# Patient Record
Sex: Male | Born: 1975 | Race: White | Hispanic: No | Marital: Married | Smoking: Never smoker
Health system: Southern US, Community
[De-identification: ages and names within clinical notes are randomized; demographics above are authoritative.]

## PROBLEM LIST (undated history)

## (undated) DIAGNOSIS — I10 Essential (primary) hypertension: Secondary | ICD-10-CM

## (undated) DIAGNOSIS — E119 Type 2 diabetes mellitus without complications: Secondary | ICD-10-CM

## (undated) DIAGNOSIS — J189 Pneumonia, unspecified organism: Secondary | ICD-10-CM

## (undated) HISTORY — PX: CYST EXCISION: SHX5701

---
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
## 2005-06-26 ENCOUNTER — Observation Stay (HOSPITAL_COMMUNITY): Admission: EM | Admit: 2005-06-26 | Discharge: 2005-06-26 | Payer: Self-pay | Admitting: Emergency Medicine

## 2016-09-11 ENCOUNTER — Inpatient Hospital Stay (HOSPITAL_COMMUNITY): Payer: BLUE CROSS/BLUE SHIELD

## 2016-09-11 ENCOUNTER — Inpatient Hospital Stay (HOSPITAL_COMMUNITY)
Admission: EM | Admit: 2016-09-11 | Discharge: 2016-09-15 | DRG: 097 | Disposition: A | Discharge status: Home / Self Care | Payer: BLUE CROSS/BLUE SHIELD | Attending: Internal Medicine | Admitting: Internal Medicine

## 2016-09-11 ENCOUNTER — Emergency Department (HOSPITAL_COMMUNITY): Payer: BLUE CROSS/BLUE SHIELD

## 2016-09-11 ENCOUNTER — Encounter (HOSPITAL_COMMUNITY): Payer: Self-pay | Admitting: Emergency Medicine

## 2016-09-11 DIAGNOSIS — Z6839 Body mass index (BMI) 39.0-39.9, adult: Secondary | ICD-10-CM | POA: Diagnosis not present

## 2016-09-11 DIAGNOSIS — Z8701 Personal history of pneumonia (recurrent): Secondary | ICD-10-CM | POA: Diagnosis not present

## 2016-09-11 DIAGNOSIS — G44001 Cluster headache syndrome, unspecified, intractable: Secondary | ICD-10-CM | POA: Diagnosis not present

## 2016-09-11 DIAGNOSIS — R4182 Altered mental status, unspecified: Secondary | ICD-10-CM | POA: Diagnosis present

## 2016-09-11 DIAGNOSIS — G030 Nonpyogenic meningitis: Principal | ICD-10-CM | POA: Diagnosis present

## 2016-09-11 DIAGNOSIS — R51 Headache: Secondary | ICD-10-CM

## 2016-09-11 DIAGNOSIS — Z880 Allergy status to penicillin: Secondary | ICD-10-CM | POA: Diagnosis not present

## 2016-09-11 DIAGNOSIS — G9340 Encephalopathy, unspecified: Secondary | ICD-10-CM | POA: Diagnosis not present

## 2016-09-11 DIAGNOSIS — A872 Lymphocytic choriomeningitis: Secondary | ICD-10-CM | POA: Diagnosis not present

## 2016-09-11 DIAGNOSIS — G0490 Encephalitis and encephalomyelitis, unspecified: Secondary | ICD-10-CM | POA: Diagnosis not present

## 2016-09-11 DIAGNOSIS — G9349 Other encephalopathy: Secondary | ICD-10-CM | POA: Diagnosis not present

## 2016-09-11 DIAGNOSIS — R519 Headache, unspecified: Secondary | ICD-10-CM

## 2016-09-11 DIAGNOSIS — R410 Disorientation, unspecified: Secondary | ICD-10-CM | POA: Diagnosis not present

## 2016-09-11 DIAGNOSIS — F17220 Nicotine dependence, chewing tobacco, uncomplicated: Secondary | ICD-10-CM | POA: Diagnosis not present

## 2016-09-11 DIAGNOSIS — E669 Obesity, unspecified: Secondary | ICD-10-CM | POA: Diagnosis not present

## 2016-09-11 DIAGNOSIS — Z886 Allergy status to analgesic agent status: Secondary | ICD-10-CM | POA: Diagnosis not present

## 2016-09-11 HISTORY — DX: Pneumonia, unspecified organism: J18.9

## 2016-09-11 LAB — URINALYSIS, ROUTINE W REFLEX MICROSCOPIC
BILIRUBIN URINE: NEGATIVE
Glucose, UA: NEGATIVE mg/dL
HGB URINE DIPSTICK: NEGATIVE
Leukocytes, UA: NEGATIVE
NITRITE: NEGATIVE
SPECIFIC GRAVITY, URINE: 1.015 (ref 1.005–1.030)
pH: 7 (ref 5.0–8.0)

## 2016-09-11 LAB — URINE MICROSCOPIC-ADD ON

## 2016-09-11 LAB — I-STAT CHEM 8, ED
BUN: 14 mg/dL (ref 6–20)
CHLORIDE: 103 mmol/L (ref 101–111)
CREATININE: 0.9 mg/dL (ref 0.61–1.24)
Calcium, Ion: 1.18 mmol/L (ref 1.15–1.40)
GLUCOSE: 189 mg/dL — AB (ref 65–99)
HCT: 43 % (ref 39.0–52.0)
Hemoglobin: 14.6 g/dL (ref 13.0–17.0)
POTASSIUM: 3.7 mmol/L (ref 3.5–5.1)
Sodium: 140 mmol/L (ref 135–145)
TCO2: 23 mmol/L (ref 0–100)

## 2016-09-11 LAB — COMPREHENSIVE METABOLIC PANEL
ALT: 22 U/L (ref 17–63)
AST: 25 U/L (ref 15–41)
Albumin: 4.5 g/dL (ref 3.5–5.0)
Alkaline Phosphatase: 48 U/L (ref 38–126)
Anion gap: 10 (ref 5–15)
BILIRUBIN TOTAL: 0.6 mg/dL (ref 0.3–1.2)
BUN: 14 mg/dL (ref 6–20)
CHLORIDE: 105 mmol/L (ref 101–111)
CO2: 24 mmol/L (ref 22–32)
CREATININE: 0.94 mg/dL (ref 0.61–1.24)
Calcium: 9.6 mg/dL (ref 8.9–10.3)
Glucose, Bld: 198 mg/dL — ABNORMAL HIGH (ref 65–99)
POTASSIUM: 3.7 mmol/L (ref 3.5–5.1)
Sodium: 139 mmol/L (ref 135–145)
TOTAL PROTEIN: 7.7 g/dL (ref 6.5–8.1)

## 2016-09-11 LAB — CBC WITH DIFFERENTIAL/PLATELET
Basophils Absolute: 0 10*3/uL (ref 0.0–0.1)
Basophils Relative: 0 %
EOS PCT: 2 %
Eosinophils Absolute: 0.2 10*3/uL (ref 0.0–0.7)
HEMATOCRIT: 43 % (ref 39.0–52.0)
Hemoglobin: 14.7 g/dL (ref 13.0–17.0)
LYMPHS ABS: 2.1 10*3/uL (ref 0.7–4.0)
LYMPHS PCT: 27 %
MCH: 29.5 pg (ref 26.0–34.0)
MCHC: 34.2 g/dL (ref 30.0–36.0)
MCV: 86.3 fL (ref 78.0–100.0)
MONO ABS: 0.4 10*3/uL (ref 0.1–1.0)
MONOS PCT: 5 %
Neutro Abs: 5.2 10*3/uL (ref 1.7–7.7)
Neutrophils Relative %: 66 %
PLATELETS: 193 10*3/uL (ref 150–400)
RBC: 4.98 MIL/uL (ref 4.22–5.81)
RDW: 13 % (ref 11.5–15.5)
WBC: 7.9 10*3/uL (ref 4.0–10.5)

## 2016-09-11 LAB — SALICYLATE LEVEL

## 2016-09-11 LAB — I-STAT CG4 LACTIC ACID, ED
Lactic Acid, Venous: 3.12 mmol/L (ref 0.5–1.9)
Lactic Acid, Venous: 2.88 mmol/L (ref 0.5–1.9)

## 2016-09-11 LAB — RAPID URINE DRUG SCREEN, HOSP PERFORMED
Amphetamines: POSITIVE — AB
Barbiturates: NOT DETECTED
Benzodiazepines: NOT DETECTED
COCAINE: NOT DETECTED
OPIATES: NOT DETECTED
TETRAHYDROCANNABINOL: NOT DETECTED

## 2016-09-11 LAB — I-STAT TROPONIN, ED: TROPONIN I, POC: 0 ng/mL (ref 0.00–0.08)

## 2016-09-11 LAB — ACETAMINOPHEN LEVEL

## 2016-09-11 MED ORDER — DEXTROSE 5 % IV SOLN
10.0000 mg/kg | Freq: Three times a day (TID) | INTRAVENOUS | Status: DC
  Administered 2016-09-12 – 2016-09-15 (×10): 935 mg via INTRAVENOUS
  Filled 2016-09-11 (×17): qty 18.7

## 2016-09-11 MED ORDER — DOXYCYCLINE HYCLATE 100 MG IV SOLR
100.0000 mg | Freq: Two times a day (BID) | INTRAVENOUS | Status: DC
  Administered 2016-09-11 – 2016-09-15 (×8): 100 mg via INTRAVENOUS
  Filled 2016-09-11 (×10): qty 100

## 2016-09-11 MED ORDER — ACETAMINOPHEN 650 MG RE SUPP
650.0000 mg | Freq: Four times a day (QID) | RECTAL | Status: DC | PRN
  Administered 2016-09-11: 650 mg via RECTAL
  Filled 2016-09-11: qty 1

## 2016-09-11 MED ORDER — STERILE WATER FOR INJECTION IJ SOLN
INTRAMUSCULAR | Status: AC
  Administered 2016-09-11: 1.2 mL
  Filled 2016-09-11: qty 10

## 2016-09-11 MED ORDER — LORAZEPAM 2 MG/ML IJ SOLN
INTRAMUSCULAR | Status: AC
  Filled 2016-09-11: qty 1

## 2016-09-11 MED ORDER — ONDANSETRON HCL 4 MG PO TABS: 4.0000 mg | ORAL_TABLET | Freq: Four times a day (QID) | ORAL | Status: DC | PRN

## 2016-09-11 MED ORDER — DEXTROSE 5 % IV SOLN
2.0000 g | Freq: Two times a day (BID) | INTRAVENOUS | Status: DC
  Administered 2016-09-11 – 2016-09-15 (×8): 2 g via INTRAVENOUS
  Filled 2016-09-11 (×9): qty 2

## 2016-09-11 MED ORDER — LORAZEPAM 2 MG/ML IJ SOLN
2.0000 mg | INTRAMUSCULAR | Status: DC | PRN
  Administered 2016-09-11: 2 mg via INTRAVENOUS
  Filled 2016-09-11: qty 1

## 2016-09-11 MED ORDER — DEXTROSE 5 % IV SOLN
2.0000 g | Freq: Once | INTRAVENOUS | Status: AC
  Administered 2016-09-11: 2 g via INTRAVENOUS
  Filled 2016-09-11: qty 2

## 2016-09-11 MED ORDER — NALOXONE HCL 2 MG/2ML IJ SOSY
1.0000 mg | PREFILLED_SYRINGE | Freq: Once | INTRAMUSCULAR | Status: AC
  Administered 2016-09-11: 1 mg via INTRAVENOUS
  Filled 2016-09-11: qty 2

## 2016-09-11 MED ORDER — LORAZEPAM 2 MG/ML IJ SOLN
1.0000 mg | Freq: Once | INTRAMUSCULAR | Status: AC
  Administered 2016-09-11: 1 mg via INTRAVENOUS
  Filled 2016-09-11: qty 1

## 2016-09-11 MED ORDER — DEXTROSE 5 % IV SOLN
10.0000 mg/kg | Freq: Once | INTRAVENOUS | Status: AC
  Administered 2016-09-11: 800 mg via INTRAVENOUS
  Filled 2016-09-11: qty 16

## 2016-09-11 MED ORDER — SODIUM CHLORIDE 0.9 % IV SOLN
INTRAVENOUS | Status: DC
  Administered 2016-09-11 – 2016-09-15 (×4): via INTRAVENOUS

## 2016-09-11 MED ORDER — VANCOMYCIN HCL IN DEXTROSE 1-5 GM/200ML-% IV SOLN
1000.0000 mg | Freq: Three times a day (TID) | INTRAVENOUS | Status: DC
  Administered 2016-09-12 (×3): 1000 mg via INTRAVENOUS
  Filled 2016-09-11 (×4): qty 200

## 2016-09-11 MED ORDER — HALOPERIDOL LACTATE 5 MG/ML IJ SOLN
5.0000 mg | Freq: Four times a day (QID) | INTRAMUSCULAR | Status: DC | PRN
  Administered 2016-09-11 (×2): 5 mg via INTRAVENOUS
  Filled 2016-09-11 (×2): qty 1

## 2016-09-11 MED ORDER — DOXYCYCLINE HYCLATE 100 MG IV SOLR
100.0000 mg | Freq: Once | INTRAVENOUS | Status: AC
  Administered 2016-09-11: 100 mg via INTRAVENOUS
  Filled 2016-09-11: qty 100

## 2016-09-11 MED ORDER — ACETAMINOPHEN 650 MG RE SUPP
650.0000 mg | Freq: Once | RECTAL | Status: AC
  Administered 2016-09-11: 650 mg via RECTAL
  Filled 2016-09-11: qty 1

## 2016-09-11 MED ORDER — ZIPRASIDONE MESYLATE 20 MG IM SOLR
10.0000 mg | Freq: Once | INTRAMUSCULAR | Status: AC
  Administered 2016-09-11: 10 mg via INTRAMUSCULAR
  Filled 2016-09-11: qty 20

## 2016-09-11 MED ORDER — ONDANSETRON HCL 4 MG/2ML IJ SOLN
INTRAMUSCULAR | Status: AC
  Administered 2016-09-11: 4 mg
  Filled 2016-09-11: qty 2

## 2016-09-11 MED ORDER — VANCOMYCIN HCL IN DEXTROSE 1-5 GM/200ML-% IV SOLN
1000.0000 mg | Freq: Once | INTRAVENOUS | Status: AC
  Administered 2016-09-11: 1000 mg via INTRAVENOUS
  Filled 2016-09-11: qty 200

## 2016-09-11 MED ORDER — ACETAMINOPHEN 325 MG PO TABS
650.0000 mg | ORAL_TABLET | Freq: Four times a day (QID) | ORAL | Status: DC | PRN
  Administered 2016-09-12 – 2016-09-15 (×5): 650 mg via ORAL
  Filled 2016-09-11 (×5): qty 2

## 2016-09-11 MED ORDER — SODIUM CHLORIDE 0.9 % IV BOLUS (SEPSIS)
1000.0000 mL | Freq: Once | INTRAVENOUS | Status: AC
  Administered 2016-09-11: 1000 mL via INTRAVENOUS

## 2016-09-11 MED ORDER — ONDANSETRON HCL 4 MG/2ML IJ SOLN: 4.0000 mg | Freq: Four times a day (QID) | INTRAMUSCULAR | Status: DC | PRN

## 2016-09-11 MED ORDER — VANCOMYCIN HCL 10 G IV SOLR
1500.0000 mg | Freq: Once | INTRAVENOUS | Status: AC
  Administered 2016-09-11: 1500 mg via INTRAVENOUS
  Filled 2016-09-11: qty 1500

## 2016-09-11 NOTE — ED Notes (Signed)
MD Zammit notified of repeat lactic of 2.88.

## 2016-09-11 NOTE — Progress Notes (Signed)
Dr. Clearence PedSchorr notified patient passed stroke swallow screen requested to be performed by neurologist. Patient immediately back to sleep post test. Awaiting response.

## 2016-09-11 NOTE — ED Notes (Signed)
Soft restraints checked on wrist and ankles at this time.

## 2016-09-11 NOTE — ED Provider Notes (Signed)
[facility]-EMERGENCY DEPT Provider Note   CSN: [id number] Arrival date & time: 09/11/16  0806  By signing my name below, I, Clovis PuAvnee Patel, attest that this documentation has been prepared under the direction and in the presence of Bethann BerkshireJoseph Jeremih Dearmas, MD  Electronically Signed: Clovis PuAvnee Patel, ED Scribe. 09/11/16. 8:34 AM.  LEVEL 5 CAVEAT DUE TO ALTERED MENTAL STATUS    History   Chief Complaint Chief Complaint  Patient presents with  . Altered Mental Status    Patient has had headaches off and on for a few days now he has been taken Sudafed cold medicine. His wife states that he awoke this morning around 6 and was confused   The history is provided by a parent and the spouse. No language interpreter was used.  Altered Mental Status   This is a new problem. The current episode started 1 to 2 hours ago. The problem has not changed since onset.Associated symptoms include weakness. Risk factors include the patient not taking medications correctly. His past medical history does not include seizures.   HPI Comments: LEVEL 5 CAVEAT DUE TO ALTERED MENTAL STATUS   Ethan Logan is a 40 y.o. male who presents to the Emergency Department with altered mental status x today. Per family, pt took a handful of 600 mg ibuprofen due to a headache. Father states pt's wife called the ambulance. Wife notes pt awoke at 6 AM today with confusion.   No past medical history on file.  There are no active problems to display for this patient.   No past surgical history on file.   Home Medications    Prior to Admission medications   Not on File    Family History No family history on file.  Social History Social History  Substance Use Topics  . Smoking status: Not on file  . Smokeless tobacco: Not on file  . Alcohol use Not on file     Allergies   Patient has no allergy information on record.   Review of Systems Review of Systems  Unable to perform ROS: Mental status change  Neurological:  Positive for weakness.   Physical Exam Updated Vital Signs There were no vitals taken for this visit.  Physical Exam  Constitutional: He appears well-developed.  HENT:  Head: Normocephalic.  Eyes: Conjunctivae and EOM are normal. No scleral icterus.  Neck: Neck supple. No thyromegaly present.  Cardiovascular: Normal rate and regular rhythm.  Exam reveals no gallop and no friction rub.   No murmur heard. Pulmonary/Chest: No stridor. He has no wheezes. He has no rales. He exhibits no tenderness.  Abdominal: He exhibits no distension. There is no tenderness. There is no rebound.  Musculoskeletal: Normal range of motion. He exhibits no edema.  Lymphadenopathy:    He has no cervical adenopathy.  Neurological: He is alert. He exhibits normal muscle tone. Coordination normal.  Moderate weakness of right upper extremity and right lower extremity.   Patient is alert. He will respond to his name. Patient confused and not answering questions appropriately  Skin: No rash noted. No erythema.  Nursing note and vitals reviewed.    ED Treatments / Results  DIAGNOSTIC STUDIES:  Oxygen Saturation is 100% on RA, normal by my interpretation.    COORDINATION OF CARE:  8:27 AM Discussed treatment plan with pt at bedside and pt agreed to plan.  Labs (all labs ordered are listed, but only abnormal results are displayed) Labs Reviewed - No data to display  EKG  EKG Interpretation None  Radiology No results found.  Procedures Procedures (including critical care time)  Medications Ordered in ED Medications - No data to display   Initial Impression / Assessment and Plan / ED Course  I have reviewed the triage vital signs and the nursing notes.  Pertinent labs & imaging results that were available during my care of the patient were reviewed by me and considered in my medical decision making (see chart for details).  Clinical Course     Altered mental status. Labs including  urine CT scan chest x-ray all unremarkable. I consult a the neural hospitalist and they recommended treating the patient for possible meningitis with Rocephin and vancomycin acyclovir. Patient was given 2 mg of Ativan and tented Geodon but LP was unable to be performed because the patient would not stay still. [facility] hospital[facility] consult and saw the patient and stated [facility]. Hospital suggested adding doxycycline to his treatment [facility]. CRITICAL CARE Performed by: Erving Sassano L Total critical care time:45 minutes Critical care time was exclusive of separately billable procedures and treating other patients. Critical care was necessary to treat or prevent imminent or life-threatening deterioration. Critical care was time spent personally by me on the following activities: development of treatment plan with patient and/or surrogate as well as nursing, discussions with consultants, evaluation of patient's response to treatment, examination of patient, obtaining history from patient or surrogate, ordering and performing treatments and interventions, ordering and review of laboratory studies, ordering and review of radiographic studies, pulse oximetry and re-evaluation of patient's condition.  Final Clinical Impressions(s) / ED Diagnoses   Final diagnoses:  None    New Prescriptions New Prescriptions   No medications on file     Bethann BerkshireJoseph Orah Sonnen, MD 09/11/16 1150

## 2016-09-11 NOTE — ED Notes (Signed)
Unable to obtain BP readings at this time due to lab work.

## 2016-09-11 NOTE — Consult Note (Signed)
Admission H&P    Chief Complaint: altered mental status with febrile illness.  HPI: Ethan Logan is an 40 y.o. male with no known past medical history transferred from [facility] for further evaluation of acute mental status change with confusion. Patient reportedly has had upper respiratory infection type symptoms over the past 1 week with nasal congestion. He was noted at about 1 AM to have confusion which became progressively worse. Intermittent myoclonic type jerking movements were noted. No frank seizure activity was reported. He was febrile with temperature up to 101.7. CT scan of his head showed no acute intracranial abnormality. Attempted lumbar puncture was unsuccessful. He was started on vancomycin and on doxycycline. He was also started on acyclovir. Mental status has improved. He is still confused but responds to others around him verbally. No focal deficits were noted at any point.  History reviewed. No pertinent past medical history.  History reviewed. No pertinent surgical history.  No family history on file. Social History:  reports that he has never smoked. He has never used smokeless tobacco. He reports that he drinks alcohol. He reports that he does not use drugs.  Allergies:  Allergies  Allergen Reactions  . Aspirin Swelling  . Penicillins Rash    No prescriptions prior to admission.    ROS: Unobtainable as patient remains confused.  Physical Examination: Blood pressure 119/71, pulse 69, temperature 100.2 F (37.9 C), temperature source Axillary, resp. rate (!) 24, height '6\' 1"'  (1.854 m), weight 134.1 kg (295 lb 9.6 oz), SpO2 95 %.  HEENT-  Normocephalic, no lesions, without obvious abnormality.  Normal external eye and conjunctiva.  Normal TM's bilaterally.  Normal auditory canals and external ears. Normal external nose, mucus membranes and septum.  Normal pharynx. Neck supple with no masses, nodes, nodules or enlargement. No clinical signs of meningeal  irritation Cardiovascular - regular rate and rhythm, S1, S2 normal, no murmur, click, rub or gallop Lungs - chest clear, no wheezing, rales, normal symmetric air entry Abdomen - soft, non-tender; bowel sounds normal; no masses,  no organomegaly Extremities - no joint deformities, effusion, or inflammation  Neurologic Examination: Mental Status: Slightly lethargic, disoriented to time and place, agitated, requiring 4. restraints.  Speech fluent without evidence of aphasia. Able to follow commands without difficulty. Cranial Nerves: II-Visual fields were normal. III/IV/VI-Pupils were equal and reacted normally to light. Extraocular movements were full and conjugate.    V/VII-no facial numbness and no facial weakness. VIII-normal. X-normal speech. XI: trapezius strength/neck flexion strength normal bilaterally XII-midline tongue extension with normal strength. Motor: 5/5 bilaterally with normal tone and bulk Sensory: Normal throughout. Deep Tendon Reflexes: 2+ and symmetric. Plantars: mute bilaterally  Results for orders placed or performed during the hospital encounter of 09/11/16 (from the past 48 hour(s))  CBC with Differential/Platelet     Status: None   Collection Time: 09/11/16  8:38 AM  Result Value Ref Range   WBC 7.9 4.0 - 10.5 K/uL   RBC 4.98 4.22 - 5.81 MIL/uL   Hemoglobin 14.7 13.0 - 17.0 g/dL   HCT 43.0 39.0 - 52.0 %   MCV 86.3 78.0 - 100.0 fL   MCH 29.5 26.0 - 34.0 pg   MCHC 34.2 30.0 - 36.0 g/dL   RDW 13.0 11.5 - 15.5 %   Platelets 193 150 - 400 K/uL   Neutrophils Relative % 66 %   Neutro Abs 5.2 1.7 - 7.7 K/uL   Lymphocytes Relative 27 %   Lymphs Abs 2.1 0.7 - 4.0 K/uL  Monocytes Relative 5 %   Monocytes Absolute 0.4 0.1 - 1.0 K/uL   Eosinophils Relative 2 %   Eosinophils Absolute 0.2 0.0 - 0.7 K/uL   Basophils Relative 0 %   Basophils Absolute 0.0 0.0 - 0.1 K/uL  Comprehensive metabolic panel     Status: Abnormal   Collection Time: 09/11/16  8:38 AM   Result Value Ref Range   Sodium 139 135 - 145 mmol/L   Potassium 3.7 3.5 - 5.1 mmol/L   Chloride 105 101 - 111 mmol/L   CO2 24 22 - 32 mmol/L   Glucose, Bld 198 (H) 65 - 99 mg/dL   BUN 14 6 - 20 mg/dL   Creatinine, Ser 0.94 0.61 - 1.24 mg/dL   Calcium 9.6 8.9 - 10.3 mg/dL   Total Protein 7.7 6.5 - 8.1 g/dL   Albumin 4.5 3.5 - 5.0 g/dL   AST 25 15 - 41 U/L   ALT 22 17 - 63 U/L   Alkaline Phosphatase 48 38 - 126 U/L   Total Bilirubin 0.6 0.3 - 1.2 mg/dL   GFR calc non Af Amer >60 >60 mL/min   GFR calc Af Amer >60 >60 mL/min    Comment: (NOTE) The eGFR has been calculated using the CKD EPI equation. This calculation has not been validated in all clinical situations. eGFR's persistently <60 mL/min signify possible Chronic Kidney Disease.    Anion gap 10 5 - 15  Acetaminophen level     Status: Abnormal   Collection Time: 09/11/16  8:40 AM  Result Value Ref Range   Acetaminophen (Tylenol), Serum <10 (L) 10 - 30 ug/mL    Comment:        THERAPEUTIC CONCENTRATIONS VARY SIGNIFICANTLY. A RANGE OF 10-30 ug/mL MAY BE AN EFFECTIVE CONCENTRATION FOR MANY PATIENTS. HOWEVER, SOME ARE BEST TREATED AT CONCENTRATIONS OUTSIDE THIS RANGE. ACETAMINOPHEN CONCENTRATIONS >150 ug/mL AT 4 HOURS AFTER INGESTION AND >50 ug/mL AT 12 HOURS AFTER INGESTION ARE OFTEN ASSOCIATED WITH TOXIC REACTIONS.   Salicylate level     Status: None   Collection Time: 09/11/16  8:40 AM  Result Value Ref Range   Salicylate Lvl <4.6 2.8 - 30.0 mg/dL  Blood culture (routine x 2)     Status: None (Preliminary result)   Collection Time: 09/11/16  8:42 AM  Result Value Ref Range   Specimen Description BLOOD LEFT ANTECUBITAL    Special Requests BOTTLES DRAWN AEROBIC AND ANAEROBIC 6CC    Culture PENDING    Report Status PENDING   I-stat chem 8, ed     Status: Abnormal   Collection Time: 09/11/16  8:50 AM  Result Value Ref Range   Sodium 140 135 - 145 mmol/L   Potassium 3.7 3.5 - 5.1 mmol/L   Chloride 103 101 -  111 mmol/L   BUN 14 6 - 20 mg/dL   Creatinine, Ser 0.90 0.61 - 1.24 mg/dL   Glucose, Bld 189 (H) 65 - 99 mg/dL   Calcium, Ion 1.18 1.15 - 1.40 mmol/L   TCO2 23 0 - 100 mmol/L   Hemoglobin 14.6 13.0 - 17.0 g/dL   HCT 43.0 39.0 - 52.0 %  I-Stat CG4 Lactic Acid, ED     Status: Abnormal   Collection Time: 09/11/16  8:51 AM  Result Value Ref Range   Lactic Acid, Venous 3.12 (HH) 0.5 - 1.9 mmol/L   Comment NOTIFIED PHYSICIAN   I-stat troponin, ED     Status: None   Collection Time: 09/11/16  8:55 AM  Result Value Ref Range   Troponin i, poc 0.00 0.00 - 0.08 ng/mL   Comment 3            Comment: Due to the release kinetics of cTnI, a negative result within the first hours of the onset of symptoms does not rule out myocardial infarction with certainty. If myocardial infarction is still suspected, repeat the test at appropriate intervals.   Urinalysis, Routine w reflex microscopic (not at [facility])     Status: Abnormal   Collection Time: 09/11/16  9:03 AM  Result Value Ref Range   Color, Urine YELLOW YELLOW   APPearance CLEAR CLEAR   Specific Gravity, Urine 1.015 1.005 - 1.030   pH 7.0 5.0 - 8.0   Glucose, UA NEGATIVE NEGATIVE mg/dL   Hgb urine dipstick NEGATIVE NEGATIVE   Bilirubin Urine NEGATIVE NEGATIVE   Ketones, ur TRACE (A) NEGATIVE mg/dL   Protein, ur TRACE (A) NEGATIVE mg/dL   Nitrite NEGATIVE NEGATIVE   Leukocytes, UA NEGATIVE NEGATIVE  Rapid urine drug screen (hospital performed)     Status: Abnormal   Collection Time: 09/11/16  9:03 AM  Result Value Ref Range   Opiates NONE DETECTED NONE DETECTED   Cocaine NONE DETECTED NONE DETECTED   Benzodiazepines NONE DETECTED NONE DETECTED   Amphetamines POSITIVE (A) NONE DETECTED   Tetrahydrocannabinol NONE DETECTED NONE DETECTED   Barbiturates NONE DETECTED NONE DETECTED    Comment:        DRUG SCREEN FOR MEDICAL PURPOSES ONLY.  IF CONFIRMATION IS NEEDED FOR ANY PURPOSE, NOTIFY LAB WITHIN 5 DAYS.        LOWEST DETECTABLE  LIMITS FOR URINE DRUG SCREEN Drug Class       Cutoff (ng/mL) Amphetamine      1000 Barbiturate      200 Benzodiazepine   419 Tricyclics       379 Opiates          300 Cocaine          300 THC              50   Urine microscopic-add on     Status: Abnormal   Collection Time: 09/11/16  9:03 AM  Result Value Ref Range   Squamous Epithelial / LPF 0-5 (A) NONE SEEN   WBC, UA 0-5 0 - 5 WBC/hpf   RBC / HPF 0-5 0 - 5 RBC/hpf   Bacteria, UA FEW (A) NONE SEEN   Urine-Other MUCOUS PRESENT   Blood culture (routine x 2)     Status: None (Preliminary result)   Collection Time: 09/11/16 10:27 AM  Result Value Ref Range   Specimen Description BLOOD BLOOD LEFT FOREARM    Special Requests BOTTLES DRAWN AEROBIC AND ANAEROBIC 6CC    Culture PENDING    Report Status PENDING   I-Stat CG4 Lactic Acid, ED     Status: Abnormal   Collection Time: 09/11/16 12:24 PM  Result Value Ref Range   Lactic Acid, Venous 2.88 (HH) 0.5 - 1.9 mmol/L   Comment NOTIFIED PHYSICIAN    Ct Head Wo Contrast  Result Date: 09/11/2016 CLINICAL DATA:  Altered mental status.  Headache.  Confusion. EXAM: CT HEAD WITHOUT CONTRAST TECHNIQUE: Contiguous axial images were obtained from the base of the skull through the vertex without intravenous contrast. COMPARISON:  None. FINDINGS: Brain: No evidence of acute infarction, hemorrhage, hydrocephalus, extra-axial collection or mass lesion/mass effect. Normal cerebral volume. No white matter disease. Vascular: No hyperdense vessel or unexpected calcification. Skull: Normal.  Negative for fracture or focal lesion. Sinuses/Orbits: No acute finding. Mild mucosal thickening LEFT division sphenoid. Other: None. IMPRESSION: Negative exam.  Findings discussed with ordering provider. Electronically Signed   By: Staci Righter M.D.   On: 09/11/2016 09:08   Dg Chest Portable 1 View  Result Date: 09/11/2016 CLINICAL DATA:  Altered mental status. EXAM: PORTABLE CHEST 1 VIEW COMPARISON:  None.  FINDINGS: Poor inspiration. Borderline enlarged cardiac silhouette. Prominent pulmonary vasculature and interstitial markings, accentuated by the poor inspiration. Unremarkable bones. IMPRESSION: No acute abnormality. Electronically Signed   By: Claudie Revering M.D.   On: 09/11/2016 09:46    Assessment/Plan 40 year old man presenting with an acute febrile illness with associated altered mental status. Patient had no focal neurological deficits, and also had no clinical signs of meningeal irritation. CNS infection is unlikely, but cannot be completely ruled out at this point.  Recommendations: 1. MRI of the brain without contrast 2. EEG, routine adult study 3. Agree with obtaining lumbar puncture under fluoroscopy 4. Continue current antibiotic and antiviral coverage  We will continue to follow this patient closely with you.  C.R. Nicole Kindred, [place] [facility]osp[facility] [phone]  09/11/2016, 9:48 PM

## 2016-09-11 NOTE — ED Notes (Signed)
MD Zammit notified of pt's temp foley reading of 101.1.

## 2016-09-11 NOTE — ED Notes (Signed)
Mother reports pt took ibuprofen and pseudoephedrine.

## 2016-09-11 NOTE — Progress Notes (Signed)
Pharmacy Antibiotic Note  Ethan Logan is a 40 y.o. male admitted on 09/11/2016 with acute encephalopathy/headache -  r/o meningitis.  Pharmacy has been consulted for vancomycin/acyclovir dosing. Already on ceftriaxone and doxycycline (possibility of tick-borne illness - checking RMSF, Ehrlichia) per MD. LP, MRI brain pending. Afeb, wbc wnl. SCr 0.9, normalized CrCl~99.  Received vancomycin 1g x 1 at 1029 this AM, acyclovir 800mg  x1 at 1049 this AM.  Plan: Vancomycin 1500mg  x1 (to equal 2500mg  total load) IV x1; then 1g IV q8h Acyclovir 935mg  (10mg /kg, Adjusted BWt) IV q8h - f/u HSV pcr CTX 2g IV q12h Doxy 100mg  IV q12h - f/u RMSF/Ehrlichia results Monitor clinical progress, c/s, renal function, abx plan/LOT VT@SS  as indicated F/u LP, MRI brain   Height: 6\' 1"  (185.4 cm) Weight: 250 lb (113.4 kg) IBW/kg (Calculated) : 79.9  Temp (24hrs), Avg:99.6 F (37.6 C), Min:97.3 F (36.3 C), Max:101.7 F (38.7 C)   Recent Labs Lab 09/11/16 0838 09/11/16 0850 09/11/16 0851 09/11/16 1224  WBC 7.9  --   --   --   CREATININE 0.94 0.90  --   --   LATICACIDVEN  --   --  3.12* 2.88*    Estimated Creatinine Clearance: 142.5 mL/min (by C-G formula based on SCr of 0.9 mg/dL).    Allergies  Allergen Reactions  . Aspirin Swelling  . Penicillins Rash    Babs BertinHaley Davian Hanshaw, PharmD, BCPS Clinical Pharmacist 09/11/2016 3:22 PM

## 2016-09-11 NOTE — ED Notes (Signed)
I-stat CG4 3.12 reported to MD.

## 2016-09-11 NOTE — ED Notes (Addendum)
Pt repeated calling out "mama", unable to state name. Pt only responds with moans, "mama", "please", and "yes".

## 2016-09-11 NOTE — ED Notes (Signed)
Pt repositioned and restraints checked at this time. Skin is intact, cap refill <3 seconds, extremities warm.

## 2016-09-11 NOTE — H&P (Addendum)
History and Physical    Ethan Logan ZOX:[id number] DOB: 1976-06-12 DOA: 09/11/2016  Referring MD/NP/PA: Bethann BerkshireJoseph Zammit, EDP PCP: No PCP Per Patient  Patient coming from: home  Chief Complaint: confusion  HPI: Ethan Logan is a 40 y.o. male without PMH. He has been having URI symptoms for the past week. History obtained by parents at bedside as patient is too confused to provide any. They state he has been taking Aleve Cold for nasal congestion. He has taken 20 tablets over the past 4 days. 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 No history of drug or ETOH use. 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 He last went hunting 1 week ago. Other than URI symptoms he has been in perfect health until this am at 1am. Wife states he started having some confusion then which progressively worsened. At 4:30 am she called patient's parents who live nextdoor for assistance. He was found sitting slumped over in the couch, with jerky movements/tremors and unable to respond to questions. EMS brought him to the ED for evaluation. He has not had any significant improvement thruout his ED stay, remains unable to answer questions exhibits intermittent jerky movements. Basic labs are WNL. Initial lactic acid 3.21 down to 2.88. UA negative, UDS positive for amphetamines (likely psuedoephedrine effect), CXR with NAD, CT Head negative, EKG no acute changes. EDP discussed with neurohospitalist, Dr. Roxy Mannsster, who believes that with confusion and HA, meningitis/encephalitis needs to be ruled out despite absence of fever. EDP has been unable to complete LP due to patient's intermittent jerky movements and body habitus. With lack of MRI over the weekend and no neurology coverage at [facility] for the next 10 days, [facility].  Past Medical/Surgical History: History reviewed. No pertinent past medical history.  History reviewed. No pertinent surgical  history.  Social History:  reports that he has never smoked. He has never used smokeless tobacco. He reports that he drinks alcohol. He reports that he does not use drugs.  Allergies: Allergies  Allergen Reactions  . Aspirin Swelling  . Penicillins Rash    Family History:  No heart disease or stoke in parents.  Prior to Admission medications   Not on File    Review of Systems:  Unable to obtain given current mental state.   Physical Exam: Vitals:   09/11/16 1145 09/11/16 1200 09/11/16 1220 09/11/16 1230  BP:   144/73 145/81  Pulse: 68 67 73 73  Resp: 18 18 23 23   Temp: 100 F (37.8 C) 100.4 F (38 C) 100.8 F (38.2 C) 100.8 F (38.2 C)  TempSrc:      SpO2: 100% 95% 100% 98%  Weight:      Height:         Constitutional: unresponsive, frequent/intermittent jerky movements of upper and lower extremities, seems pale. Obese. Eyes: PERRL, lids and conjunctivae normal ENMT: Mucous membranes are dry. Posterior pharynx clear of any exudate or lesions.Normal dentition.  Neck: normal, supple, no masses, no thyromegaly Respiratory: clear to auscultation bilaterally, no wheezing, no crackles. Normal respiratory effort. No accessory muscle use.  Cardiovascular: Regular rate and rhythm, no murmurs / rubs / gallops. No extremity edema. 2+ pedal pulses. No carotid bruits.  Abdomen: no tenderness, no masses palpated. No hepatosplenomegaly. Bowel sounds positive.  Musculoskeletal: no clubbing / cyanosis. No joint deformity upper and lower extremities. Good ROM, no contractures. Normal muscle tone.  Skin:  no rashes, lesions, ulcers. No induration Neurologic: Unable to assess given current mental state Psychiatric: Unable to assess   Labs on Admission: I have personally reviewed the following labs and imaging studies  CBC:  Recent Labs Lab 09/11/16 0838 09/11/16 0850  WBC 7.9  --   NEUTROABS 5.2  --   HGB 14.7 14.6  HCT 43.0 43.0  MCV 86.3  --   PLT 193  --    Basic  Metabolic Panel:  Recent Labs Lab 09/11/16 0838 09/11/16 0850  NA 139 140  K 3.7 3.7  CL 105 103  CO2 24  --   GLUCOSE 198* 189*  BUN 14 14  CREATININE 0.94 0.90  CALCIUM 9.6  --    GFR: Estimated Creatinine Clearance: 142.5 mL/min (by C-G formula based on SCr of 0.9 mg/dL). Liver Function Tests:  Recent Labs Lab 09/11/16 0838  AST 25  ALT 22  ALKPHOS 48  BILITOT 0.6  PROT 7.7  ALBUMIN 4.5   No results for input(s): LIPASE, AMYLASE in the last 168 hours. No results for input(s): AMMONIA in the last 168 hours. Coagulation Profile: No results for input(s): INR, PROTIME in the last 168 hours. Cardiac Enzymes: No results for input(s): CKTOTAL, CKMB, CKMBINDEX, TROPONINI in the last 168 hours. BNP (last 3 results) No results for input(s): PROBNP in the last 8760 hours. HbA1C: No results for input(s): HGBA1C in the last 72 hours. CBG: No results for input(s): GLUCAP in the last 168 hours. Lipid Profile: No results for input(s): CHOL, HDL, LDLCALC, TRIG, CHOLHDL, LDLDIRECT in the last 72 hours. Thyroid Function Tests: No results for input(s): TSH, T4TOTAL, FREET4, T3FREE, THYROIDAB in the last 72 hours. Anemia Panel: No results for input(s): VITAMINB12, FOLATE, FERRITIN, TIBC, IRON, RETICCTPCT in the last 72 hours. Urine analysis:    Component Value Date/Time   COLORURINE YELLOW 09/11/2016 0903   APPEARANCEUR CLEAR 09/11/2016 0903   LABSPEC 1.015 09/11/2016 0903   PHURINE 7.0 09/11/2016 0903   GLUCOSEU NEGATIVE 09/11/2016 0903   HGBUR NEGATIVE 09/11/2016 0903   BILIRUBINUR NEGATIVE 09/11/2016 0903   KETONESUR TRACE (A) 09/11/2016 0903   PROTEINUR TRACE (A) 09/11/2016 0903   NITRITE NEGATIVE 09/11/2016 0903   LEUKOCYTESUR NEGATIVE 09/11/2016 0903   Sepsis Labs: @LABRCNTIP (procalcitonin:4,lacticidven:4) ) Recent Results (from the past 240 hour(s))  Blood culture (routine x 2)     Status: None (Preliminary result)   Collection Time: 09/11/16  8:42 AM  Result  Value Ref Range Status   Specimen Description BLOOD LEFT ANTECUBITAL  Final   Special Requests BOTTLES DRAWN AEROBIC AND ANAEROBIC 6CC  Final   Culture PENDING  Incomplete   Report Status PENDING  Incomplete  Blood culture (routine x 2)     Status: None (Preliminary result)   Collection Time: 09/11/16 10:27 AM  Result Value Ref Range Status   Specimen Description BLOOD BLOOD LEFT FOREARM  Final   Special Requests BOTTLES DRAWN AEROBIC AND ANAEROBIC 6CC  Final   Culture PENDING  Incomplete   Report Status PENDING  Incomplete     Radiological Exams on Admission: Ct Head Wo Contrast  Result Date: 09/11/2016 CLINICAL DATA:  Altered mental status.  Headache.  Confusion. EXAM: CT HEAD WITHOUT CONTRAST TECHNIQUE: Contiguous axial images were obtained from the base of the skull through the vertex without intravenous contrast. COMPARISON:  None. FINDINGS: Brain: No evidence of acute infarction, hemorrhage, hydrocephalus, extra-axial collection or mass lesion/mass effect. Normal cerebral volume. No white matter disease. Vascular: No hyperdense vessel or  unexpected calcification. Skull: Normal. Negative for fracture or focal lesion. Sinuses/Orbits: No acute finding. Mild mucosal thickening LEFT division sphenoid. Other: None. IMPRESSION: Negative exam.  Findings discussed with ordering provider. Electronically Signed   By: Elsie StainJohn T Curnes M.D.   On: 09/11/2016 09:08   Dg Chest Portable 1 View  Result Date: 09/11/2016 CLINICAL DATA:  Altered mental status. EXAM: PORTABLE CHEST 1 VIEW COMPARISON:  None. FINDINGS: Poor inspiration. Borderline enlarged cardiac silhouette. Prominent pulmonary vasculature and interstitial markings, accentuated by the poor inspiration. Unremarkable bones. IMPRESSION: No acute abnormality. Electronically Signed   By: Beckie SaltsSteven  Reid M.D.   On: 09/11/2016 09:46    EKG: Independently reviewed. NSR, no acute ischemic changes  Assessment/Plan Active Problems:   Acute  encephalopathy   Headache    Acute Encephalopathy/Headache -DDx remains broad: meningitis/encephalitis (altho strange that no fever), seizure, organic brain disease like CVA not excluded altho very unlikely, tick-borne diseases possible given frequent hunting, altho symptoms do not appear typical and does not have the usual lab abnormalities that accompany it (leukopenia, thrombocytopenia, hyponatremia, transaminitis) or rash. ?pseudoephedrine effect especially given jerky movements and tremors, altho not sure how this would explain his encephalopathy. No h/o HIV, but if were to be HIV positive crypto meningitis would be another possibility. -EDP unable to perform LP; will ask IR or neurology to attempt [facility]. -For now cover with vanc/rocephin for ?meningitis and acyclovir for ?encephalitis. -MRI brain requested. -EEG -Place on doxycycline for possible tick-borne diseases. RMSF and Ehrlichia titers requested. -Check HIV antibody, serum crypto Ag. -Does not drink alcohol or use drugs (per family); was given narcan in ED without effect.  ADDENDUM: After patient seen, notified that his temp has risen to 101.3. This makes CNS infection more likely. With recent URI and prominent encephalopathy and no signs of meningismus, would be more concerned for encephalitis over bacterial meningitis. HSV PCR and CSF crypto will be added to LP labs.    DVT prophylaxis: SCDs given Lp  Code Status: full code  Family Communication: parents at bedside updated on plan of care and all questions answered  [facility]  Consults called: Neurology  Admission status: inpatient    Time Spent: 95 minutes  Chaya JanHERNANDEZ ACOSTA,ESTELA MD [facility] Pager [phone]  If 7PM-7AM, please contact night-coverage [email or web address] Password [email or web address]  09/11/2016, 12:58 PM

## 2016-09-11 NOTE — ED Notes (Addendum)
Report given to nurse, Derryl HarborLonnie, at [facility] [address]

## 2016-09-11 NOTE — ED Notes (Signed)
[facility] at this time.

## 2016-09-11 NOTE — ED Triage Notes (Signed)
Patient from home by EMS. EMS reports altered mental status, was reported by family that patient took a handful of 600 mg ibuprofen due to head and sinus pain. Patient responds to pain and will reply with "OK" to every question.

## 2016-09-12 ENCOUNTER — Encounter (HOSPITAL_COMMUNITY): Payer: Self-pay | Admitting: General Practice

## 2016-09-12 ENCOUNTER — Inpatient Hospital Stay (HOSPITAL_COMMUNITY): Payer: BLUE CROSS/BLUE SHIELD

## 2016-09-12 ENCOUNTER — Ambulatory Visit (HOSPITAL_COMMUNITY): Payer: BLUE CROSS/BLUE SHIELD

## 2016-09-12 DIAGNOSIS — Z880 Allergy status to penicillin: Secondary | ICD-10-CM

## 2016-09-12 DIAGNOSIS — Z886 Allergy status to analgesic agent status: Secondary | ICD-10-CM

## 2016-09-12 DIAGNOSIS — G030 Nonpyogenic meningitis: Principal | ICD-10-CM

## 2016-09-12 DIAGNOSIS — Z8701 Personal history of pneumonia (recurrent): Secondary | ICD-10-CM

## 2016-09-12 LAB — COMPREHENSIVE METABOLIC PANEL WITH GFR
ALT: 18 U/L (ref 17–63)
AST: 19 U/L (ref 15–41)
Albumin: 3.6 g/dL (ref 3.5–5.0)
Alkaline Phosphatase: 41 U/L (ref 38–126)
Anion gap: 8 (ref 5–15)
BUN: 10 mg/dL (ref 6–20)
CO2: 24 mmol/L (ref 22–32)
Calcium: 9 mg/dL (ref 8.9–10.3)
Chloride: 106 mmol/L (ref 101–111)
Creatinine, Ser: 0.92 mg/dL (ref 0.61–1.24)
GFR calc Af Amer: >60 mL/min
GFR calc non Af Amer: >60 mL/min
Glucose, Bld: 124 mg/dL — ABNORMAL HIGH (ref 65–99)
Potassium: 3.6 mmol/L (ref 3.5–5.1)
Sodium: 138 mmol/L (ref 135–145)
Total Bilirubin: 0.9 mg/dL (ref 0.3–1.2)
Total Protein: 6 g/dL — ABNORMAL LOW (ref 6.5–8.1)

## 2016-09-12 LAB — CBC
HEMATOCRIT: 37.7 % — AB (ref 39.0–52.0)
Hemoglobin: 12.4 g/dL — ABNORMAL LOW (ref 13.0–17.0)
MCH: 28.5 pg (ref 26.0–34.0)
MCHC: 32.9 g/dL (ref 30.0–36.0)
MCV: 86.7 fL (ref 78.0–100.0)
PLATELETS: 150 10*3/uL (ref 150–400)
RBC: 4.35 MIL/uL (ref 4.22–5.81)
RDW: 13.5 % (ref 11.5–15.5)
WBC: 7 10*3/uL (ref 4.0–10.5)

## 2016-09-12 LAB — CSF CELL COUNT WITH DIFFERENTIAL
Eosinophils, CSF: 0 % (ref 0–1)
Lymphs, CSF: 97 % — ABNORMAL HIGH (ref 40–80)
Monocyte-Macrophage-Spinal Fluid: 3 % — ABNORMAL LOW (ref 15–45)
Other Cells, CSF: 0
RBC Count, CSF: 0 /mm3
Segmented Neutrophils-CSF: 0 % (ref 0–6)
Tube #: 3
WBC, CSF: 330 /mm3 (ref 0–5)

## 2016-09-12 LAB — INFLUENZA PANEL BY PCR (TYPE A & B)
INFLAPCR: NEGATIVE
Influenza B By PCR: NEGATIVE

## 2016-09-12 LAB — PROTEIN, CSF: Total  Protein, CSF: 118 mg/dL — ABNORMAL HIGH (ref 15–45)

## 2016-09-12 LAB — CRYPTOCOCCAL ANTIGEN, CSF: Crypto Ag: NEGATIVE

## 2016-09-12 LAB — GLUCOSE, CSF: Glucose, CSF: 70 mg/dL (ref 40–70)

## 2016-09-12 MED ORDER — LIDOCAINE HCL (PF) 1 % IJ SOLN
10.0000 mL | Freq: Once | INTRAMUSCULAR | Status: AC
  Administered 2016-09-12: 5 mL via INTRADERMAL
  Filled 2016-09-12: qty 10

## 2016-09-12 MED ORDER — ENOXAPARIN SODIUM 40 MG/0.4ML ~~LOC~~ SOLN
40.0000 mg | SUBCUTANEOUS | Status: DC
Start: 2016-09-12 — End: 2016-09-15
  Administered 2016-09-12 – 2016-09-13 (×2): 40 mg via SUBCUTANEOUS
  Filled 2016-09-12 (×3): qty 0.4

## 2016-09-12 NOTE — Progress Notes (Signed)
Subjective: Patient states that he is feeling 100% better. He has no complaints at this time. His headache has dissipated. He is eating with no difficulties. He is eager to go home.  Objective: Current vital signs: BP 91/72 (BP Location: Left Arm)   Pulse 71   Temp 98.2 F (36.8 C) (Oral)   Resp 20   Ht 6\' 1"  (1.854 m)   Wt 134.1 kg (295 lb 9.6 oz)   SpO2 96%   BMI 39.00 kg/m  Vital signs in last 24 hours: Temp:  [98.2 F (36.8 C)-101.7 F (38.7 C)] 98.2 F (36.8 C) (12/04 0703) Pulse Rate:  [69-81] 71 (12/04 0703) Resp:  [20-24] 20 (12/04 0703) BP: (91-140)/(56-79) 91/72 (12/04 0703) SpO2:  [95 %-98 %] 96 % (12/04 0703) Weight:  [134.1 kg (295 lb 9.6 oz)] 134.1 kg (295 lb 9.6 oz) (12/03 1723)  Intake/Output from previous day: 12/03 0701 - 12/04 0700 In: 3676 [P.O.:180; I.V.:661.3; IV Piggyback:2834.7] Out: 2900 [Urine:2900] Intake/Output this shift: Total I/O In: 0  Out: 850 [Urine:850] Nutritional status: Diet regular Room service appropriate? Yes; Fluid consistency: Thin  ROS:                                                                                                                                       History obtained from the patient  General ROS: negative for - chills, fatigue, fever, night sweats, weight gain or weight loss Psychological ROS: negative for - behavioral disorder, hallucinations, memory difficulties, mood swings or suicidal ideation Ophthalmic ROS: negative for - blurry vision, double vision, eye pain or loss of vision ENT ROS: negative for - epistaxis, nasal discharge, oral lesions, sore throat, tinnitus or vertigo Allergy and Immunology ROS: negative for - hives or itchy/watery eyes Hematological and Lymphatic ROS: negative for - bleeding problems, bruising or swollen lymph nodes Endocrine ROS: negative for - galactorrhea, hair pattern changes, polydipsia/polyuria or temperature intolerance Respiratory ROS: negative for - cough, hemoptysis,  shortness of breath or wheezing Cardiovascular ROS: negative for - chest pain, dyspnea on exertion, edema or irregular heartbeat Gastrointestinal ROS: negative for - abdominal pain, diarrhea, hematemesis, nausea/vomiting or stool incontinence Genito-Urinary ROS: negative for - dysuria, hematuria, incontinence or urinary frequency/urgency Musculoskeletal ROS: negative for - joint swelling or muscular weakness Neurological ROS: as noted in HPI Dermatological ROS: negative for rash and skin lesion changes    Neurologic Exam: General: NAD Mental Status: Alert, oriented, thought content appropriate.  Speech fluent without evidence of aphasia.  Able to follow 3 step commands without difficulty. Cranial Nerves: II:  Visual fields grossly normal, pupils equal, round, reactive to light and accommodation III,IV, VI: ptosis not present, extra-ocular motions intact bilaterally V,VII: smile symmetric, facial light touch sensation normal bilaterally VIII: hearing normal bilaterally IX,X: uvula rises symmetrically XI: bilateral shoulder shrug XII: midline tongue extension without atrophy or fasciculations  Motor: Right : Upper extremity   5/5  Left:     Upper extremity   5/5  Lower extremity   5/5     Lower extremity   5/5 Tone and bulk:normal tone throughout; no atrophy noted Sensory: Pinprick and light touch intact throughout, bilaterally Deep Tendon Reflexes:  Right: Upper Extremity   Left: Upper extremity   biceps (C-5 to C-6) 2/4   biceps (C-5 to C-6) 2/4 tricep (C7) 2/4    triceps (C7) 2/4 Brachioradialis (C6) 2/4  Brachioradialis (C6) 2/4  Lower Extremity Lower Extremity  quadriceps (L-2 to L-4) 1/4   quadriceps (L-2 to L-4) 1/4 Achilles (S1) 1/4   Achilles (S1) 1/4  Plantars: Right: downgoing   Left: downgoing Cerebellar: normal finger-to-nose,  normal heel-to-shin test   Lab Results: Basic Metabolic Panel:  Recent Labs Lab 09/11/16 0838 09/11/16 0850 09/12/16 0634  NA  139 140 138  K 3.7 3.7 3.6  CL 105 103 106  CO2 24  --  24  GLUCOSE 198* 189* 124*  BUN 14 14 10   CREATININE 0.94 0.90 0.92  CALCIUM 9.6  --  9.0    Liver Function Tests:  Recent Labs Lab 09/11/16 0838 09/12/16 0634  AST 25 19  ALT 22 18  ALKPHOS 48 41  BILITOT 0.6 0.9  PROT 7.7 6.0*  ALBUMIN 4.5 3.6   No results for input(s): LIPASE, AMYLASE in the last 168 hours. No results for input(s): AMMONIA in the last 168 hours.  CBC:  Recent Labs Lab 09/11/16 0838 09/11/16 0850 09/12/16 0634  WBC 7.9  --  7.0  NEUTROABS 5.2  --   --   HGB 14.7 14.6 12.4*  HCT 43.0 43.0 37.7*  MCV 86.3  --  86.7  PLT 193  --  150   Results for EDGERRIN, CORREIA (MRN [id number]) as of 09/12/2016 13:02  Ref. Range 09/12/2016 09:36  Glucose, CSF Latest Ref Range: 40 - 70 mg/dL 70  Total  Protein, CSF Latest Ref Range: 15 - 45 mg/dL 409 (H)  RBC Count, CSF Latest Ref Range: 0 /cu mm 0  WBC, CSF Latest Ref Range: 0 - 5 /cu mm 330 (HH)  Segmented Neutrophils-CSF Latest Ref Range: 0 - 6 % 0  Lymphs, CSF Latest Ref Range: 40 - 80 % 97 (H)  Monocyte-Macrophage-Spinal Fluid Latest Ref Range: 15 - 45 % 3 (L)  Eosinophils, CSF Latest Ref Range: 0 - 1 % 0  Other Cells, CSF Unknown 0  Appearance, CSF Latest Ref Range: CLEAR  CLEAR  Color, CSF Latest Ref Range: COLORLESS  COLORLESS  Supernatant Unknown NOT INDICATED  Tube # Unknown 3   Cardiac Enzymes: No results for input(s): CKTOTAL, CKMB, CKMBINDEX, TROPONINI in the last 168 hours.  Lipid Panel: No results for input(s): CHOL, TRIG, HDL, CHOLHDL, VLDL, LDLCALC in the last 168 hours.  CBG: No results for input(s): GLUCAP in the last 168 hours.  Microbiology: Results for orders placed or performed during the hospital encounter of 09/11/16  Blood culture (routine x 2)     Status: None (Preliminary result)   Collection Time: 09/11/16  8:42 AM  Result Value Ref Range Status   Specimen Description BLOOD LEFT ANTECUBITAL  Final   Special  Requests BOTTLES DRAWN AEROBIC AND ANAEROBIC [facility]  Final   Culture PENDING  Incomplete   Report Status PENDING  Incomplete  Blood culture (routine x 2)     Status: None (Preliminary result)   Collection Time: 09/11/16 10:27 AM  Result Value Ref Range Status   Specimen  Description BLOOD BLOOD LEFT FOREARM  Final   Special Requests BOTTLES DRAWN AEROBIC AND ANAEROBIC 6CC  Final   Culture PENDING  Incomplete   Report Status PENDING  Incomplete  CSF culture     Status: None (Preliminary result)   Collection Time: 09/12/16  9:36 AM  Result Value Ref Range Status   Specimen Description CSF  Final   Special Requests TUBE 2  Final   Gram Stain   Final    WBC PRESENT, PREDOMINANTLY MONONUCLEAR NO ORGANISMS SEEN    Culture PENDING  Incomplete   Report Status PENDING  Incomplete    Coagulation Studies: No results for input(s): LABPROT, INR in the last 72 hours.  Imaging: Ct Head Wo Contrast  Result Date: 09/11/2016 CLINICAL DATA:  Altered mental status.  Headache.  Confusion. EXAM: CT HEAD WITHOUT CONTRAST TECHNIQUE: Contiguous axial images were obtained from the base of the skull through the vertex without intravenous contrast. COMPARISON:  None. FINDINGS: Brain: No evidence of acute infarction, hemorrhage, hydrocephalus, extra-axial collection or mass lesion/mass effect. Normal cerebral volume. No white matter disease. Vascular: No hyperdense vessel or unexpected calcification. Skull: Normal. Negative for fracture or focal lesion. Sinuses/Orbits: No acute finding. Mild mucosal thickening LEFT division sphenoid. Other: None. IMPRESSION: Negative exam.  Findings discussed with ordering provider. Electronically Signed   By: Elsie StainJohn T Curnes M.D.   On: 09/11/2016 09:08   Dg Chest Portable 1 View  Result Date: 09/11/2016 CLINICAL DATA:  Altered mental status. EXAM: PORTABLE CHEST 1 VIEW COMPARISON:  None. FINDINGS: Poor inspiration. Borderline enlarged cardiac silhouette. Prominent pulmonary  vasculature and interstitial markings, accentuated by the poor inspiration. Unremarkable bones. IMPRESSION: No acute abnormality. Electronically Signed   By: Beckie SaltsSteven  Reid M.D.   On: 09/11/2016 09:46   Dg Fluoro Guide Lumbar Puncture  Result Date: 09/12/2016 CLINICAL DATA:  Acute encephalopathy. EXAM: DIAGNOSTIC LUMBAR PUNCTURE UNDER FLUOROSCOPIC GUIDANCE FLUOROSCOPY TIME:  Fluoroscopy Time:  0 minutes, 15 seconds Radiation Exposure Index (if provided by the fluoroscopic device): N/A Number of Acquired Spot Images: 0 PROCEDURE: I discussed the risks (including hemorrhage, infection, headache, and nerve damage, among others), benefits, and alternatives to fluoroscopically guided lumbar puncture with the patient and with his father. In my opinion, the patient is not entirely of normal mental status despite the reported progression from yesterday. Accordingly, the patient's father was used as the primary person to grant consent, although both the patient and his father elected for the patient to undergo the procedure. We specifically discussed the high technical likelihood of success of the procedure. Standard time-out was employed. Following sterile skin prep and local anesthetic administration consisting of 1 percent lidocaine, a 22 gauge spinal needle was advanced without difficulty into the thecal sac at the at the L2-3 level. Clear CSF was returned. Opening pressure was 28 cm of water, but was not measured with the patient prone rather than laying on his side ; in my judgment, based on the patient's body habitus, turning him up on this side on the fluoroscopy table would have been risky. 12 cc of clear CSF was collected. The needle was subsequently removed and the skin cleansed and bandaged. No immediate complications were observed. IMPRESSION: 1. Technically successful fluoroscopically guided lumbar puncture at the L2-3 level, yielding 12 cc of clear CSF and an opening pressure of 28 cm of water.  Electronically Signed   By: Gaylyn RongWalter  Liebkemann M.D.   On: 09/12/2016 09:52    Medications:  Prior to Admission:  No prescriptions prior to  admission.   Scheduled: . acyclovir  10 mg/kg (Adjusted) Intravenous Q8H  . cefTRIAXone (ROCEPHIN)  IV  2 g Intravenous Q12H  . doxycycline (VIBRAMYCIN) IV  100 mg Intravenous Q12H  . enoxaparin (LOVENOX) injection  40 mg Subcutaneous Q24H  . vancomycin  1,000 mg Intravenous Q8H    Assessment/Plan: 40 year old male presenting with headache, altered mental status and febrile illness. LP was obtained CSF showing elevated white blood cell count with lymphocytic predominance. Currently HSV is pending. Patient is feeling significantly improved today. Most likely aseptic meningitis. Would continue acyclovir until HSV returned negative. Continue to treat symptomatically.  Felicie Morn PA-C [facility] [phone]  09/12/2016, 1:01 PM   I have seen the patient and agree with the above note. With his market improvement, I doubt that this represents herpetic encephalitis however I would continue acyclovir pending his culture. Appreciate ID input.  With improvement in his mental status, will defer to ID/internal medicine for antibiotic and infectious workup. Appreciate their assistance. Please call with any further questions or concerns, neurology to sign off.  Ritta Slot, MD [facility] [phone]  If 7pm- 7am, please page neurology on call as listed in AMION.

## 2016-09-12 NOTE — Progress Notes (Signed)
[facility] PROGRESS NOTE  Ethan Logan WNU:[id number] DOB: 14-Feb-1976 DOA: 09/11/2016   PCP: No PCP Per Patient     Assessment/Plan: Active Problems:   Acute encephalopathy   Headache   40 y.o. male without PMH. He has been having URI symptoms for the past week. History obtained by parents at bedside as patient is too confused to provide any. They state he has been taking Aleve Cold for nasal congestion. He has taken 20 tablets over the past 4 days. 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    He was found sitting slumped over in the couch, with jerky movements/tremors and unable to respond to questions. EMS brought him to the ED for evaluation  UDS positive for amphetamines (likely psuedoephedrine effect), CXR with NAD, CT Head negative, EKG no acute changes. Patient evaluated by neurohospitalist. patient transferred to [facility] for further evaluation  Assessment and plan  Acute infectious Encephalopathy/ /encephalitis/meningitis -DDx remains broad: meningitis/encephalitis (altho strange that no fever), seizure, organic brain disease like CVA not excluded altho very unlikely, tick-borne diseases possible given frequent hunting, although symptoms   not  typical and does not have the usual lab abnormalities that accompany it (leukopenia, thrombocytopenia, hyponatremia, transaminitis) or rash. ?pseudoephedrine effect especially given jerky movements and tremors, altho not sure how this would explain his encephalopathy.  Check HIV -EDP unable to perform LP; IR to perform LP Continue acyclovir Rocephin, doxycycline and vancomycin, status post IR guided lumbar puncture Patient has 330 white blood cells in CSF, total protein 118 Infectious disease consult, neurology following -Place on doxycycline for possible tick-borne diseases. RMSF and Ehrlichia titers, check Lyme serology  UDS positive for amphetamines No indication for MRI at this time we will cancel,  no indication for EEG   DVT prophylaxsis  Lovenox  Code Status:  Full code   Family Communication: Discussed in detail with the patient, all imaging results, lab results explained to the patient   Disposition Plan:  Due to 3 days     Consultants:  Neurology  Infectious disease  Procedures:  LP  Antibiotics: Anti-infectives    Start     Dose/Rate Route Frequency Ordered Stop   09/12/16 0030  vancomycin (VANCOCIN) IVPB 1000 mg/200 mL premix     1,000 mg 200 mL/hr over 60 Minutes Intravenous Every 8 hours 09/11/16 1532     09/11/16 2200  cefTRIAXone (ROCEPHIN) 2 g in dextrose 5 % 50 mL IVPB     2 g 100 mL/hr over 30 Minutes Intravenous Every 12 hours 09/11/16 1458     09/11/16 2200  doxycycline (VIBRAMYCIN) 100 mg in dextrose 5 % 250 mL IVPB     100 mg 125 mL/hr over 120 Minutes Intravenous Every 12 hours 09/11/16 1458     09/11/16 1830  acyclovir (ZOVIRAX) 935 mg in dextrose 5 % 150 mL IVPB     10 mg/kg  93.3 kg (Adjusted) 168.7 mL/hr over 60 Minutes Intravenous Every 8 hours 09/11/16 1532     09/11/16 1545  vancomycin (VANCOCIN) 1,500 mg in sodium chloride 0.9 % 500 mL IVPB     1,500 mg 250 mL/hr over 120 Minutes Intravenous  Once 09/11/16 1532 09/11/16 1853   09/11/16 1200  doxycycline (VIBRAMYCIN) 100 mg in dextrose 5 % 250 mL IVPB     100 mg 125 mL/hr over 120 Minutes Intravenous  Once 09/11/16 1136 09/11/16 1402   09/11/16 1000  acyclovir (ZOVIRAX) 800 mg in  dextrose 5 % 150 mL IVPB     10 mg/kg  79.9 kg (Ideal) 166 mL/hr over 60 Minutes Intravenous  Once 09/11/16 0943 09/11/16 1146   09/11/16 0945  cefTRIAXone (ROCEPHIN) 2 g in dextrose 5 % 50 mL IVPB     2 g 100 mL/hr over 30 Minutes Intravenous  Once 09/11/16 0943 09/11/16 1027   09/11/16 0945  vancomycin (VANCOCIN) IVPB 1000 mg/200 mL premix     1,000 mg 200 mL/hr over 60 Minutes Intravenous  Once 09/11/16 0943 09/11/16 1129         HPI/Subjective: Patient states that he is back to baseline, he  had the worst headache of his life yesterday  Objective: Vitals:   09/11/16 1510 09/11/16 1723 09/11/16 2048 09/12/16 0703  BP: (!) 123/56  119/71 91/72  Pulse: 81  69 71  Resp:   (!) 24 20  Temp: 98.3 F (36.8 C)  100.2 F (37.9 C) 98.2 F (36.8 C)  TempSrc: Oral  Axillary Oral  SpO2: 95%  95% 96%  Weight:  134.1 kg (295 lb 9.6 oz)    Height:  6\' 1"  (1.854 m)      Intake/Output Summary (Last 24 hours) at 09/12/16 0849 Last data filed at 09/12/16 1610  Gross per 24 hour  Intake          3675.95 ml  Output             2900 ml  Net           775.95 ml    Exam:  Examination:  General exam: Appears calm and comfortable  Respiratory system: Clear to auscultation. Respiratory effort normal. Cardiovascular system: S1 & S2 heard, RRR. No JVD, murmurs, rubs, gallops or clicks. No pedal edema. Gastrointestinal system: Abdomen is nondistended, soft and nontender. No organomegaly or masses felt. Normal bowel sounds heard. Central nervous system: Alert and oriented. No focal neurological deficits. Extremities: Symmetric 5 x 5 power. Skin: No rashes, lesions or ulcers Psychiatry: Judgement and insight appear normal. Mood & affect appropriate.     Data Reviewed: I have personally reviewed following labs and imaging studies  Micro Results Recent Results (from the past 240 hour(s))  Blood culture (routine x 2)     Status: None (Preliminary result)   Collection Time: 09/11/16  8:42 AM  Result Value Ref Range Status   Specimen Description BLOOD LEFT ANTECUBITAL  Final   Special Requests BOTTLES DRAWN AEROBIC AND ANAEROBIC 6CC  Final   Culture PENDING  Incomplete   Report Status PENDING  Incomplete  Blood culture (routine x 2)     Status: None (Preliminary result)   Collection Time: 09/11/16 10:27 AM  Result Value Ref Range Status   Specimen Description BLOOD BLOOD LEFT FOREARM  Final   Special Requests BOTTLES DRAWN AEROBIC AND ANAEROBIC [place]  Final   Culture PENDING  Incomplete    Report Status PENDING  Incomplete    Radiology Reports Ct Head Wo Contrast  Result Date: 09/11/2016 CLINICAL DATA:  Altered mental status.  Headache.  Confusion. EXAM: CT HEAD WITHOUT CONTRAST TECHNIQUE: Contiguous axial images were obtained from the base of the skull through the vertex without intravenous contrast. COMPARISON:  None. FINDINGS: Brain: No evidence of acute infarction, hemorrhage, hydrocephalus, extra-axial collection or mass lesion/mass effect. Normal cerebral volume. No white matter disease. Vascular: No hyperdense vessel or unexpected calcification. Skull: Normal. Negative for fracture or focal lesion. Sinuses/Orbits: No acute finding. Mild mucosal thickening LEFT division sphenoid. Other: None.  IMPRESSION: Negative exam.  Findings discussed with ordering provider. Electronically Signed   By: Elsie StainJohn T Curnes M.D.   On: 09/11/2016 09:08   Dg Chest Portable 1 View  Result Date: 09/11/2016 CLINICAL DATA:  Altered mental status. EXAM: PORTABLE CHEST 1 VIEW COMPARISON:  None. FINDINGS: Poor inspiration. Borderline enlarged cardiac silhouette. Prominent pulmonary vasculature and interstitial markings, accentuated by the poor inspiration. Unremarkable bones. IMPRESSION: No acute abnormality. Electronically Signed   By: Beckie SaltsSteven  Reid M.D.   On: 09/11/2016 09:46     CBC  Recent Labs Lab 09/11/16 0838 09/11/16 0850 09/12/16 0634  WBC 7.9  --  7.0  HGB 14.7 14.6 12.4*  HCT 43.0 43.0 37.7*  PLT 193  --  150  MCV 86.3  --  86.7  MCH 29.5  --  28.5  MCHC 34.2  --  32.9  RDW 13.0  --  13.5  LYMPHSABS 2.1  --   --   MONOABS 0.4  --   --   EOSABS 0.2  --   --   BASOSABS 0.0  --   --     Chemistries   Recent Labs Lab 09/11/16 0838 09/11/16 0850 09/12/16 0634  NA 139 140 138  K 3.7 3.7 3.6  CL 105 103 106  CO2 24  --  24  GLUCOSE 198* 189* 124*  BUN 14 14 10   CREATININE 0.94 0.90 0.92  CALCIUM 9.6  --  9.0  AST 25  --  19  ALT 22  --  18  ALKPHOS 48  --  41  BILITOT  0.6  --  0.9   ------------------------------------------------------------------------------------------------------------------ estimated creatinine clearance is 151.8 mL/min (by C-G formula based on SCr of 0.92 mg/dL). ------------------------------------------------------------------------------------------------------------------ No results for input(s): HGBA1C in the last 72 hours. ------------------------------------------------------------------------------------------------------------------ No results for input(s): CHOL, HDL, LDLCALC, TRIG, CHOLHDL, LDLDIRECT in the last 72 hours. ------------------------------------------------------------------------------------------------------------------ No results for input(s): TSH, T4TOTAL, T3FREE, THYROIDAB in the last 72 hours.  Invalid input(s): FREET3 ------------------------------------------------------------------------------------------------------------------ No results for input(s): VITAMINB12, FOLATE, FERRITIN, TIBC, IRON, RETICCTPCT in the last 72 hours.  Coagulation profile No results for input(s): INR, PROTIME in the last 168 hours.  No results for input(s): DDIMER in the last 72 hours.  Cardiac Enzymes No results for input(s): CKMB, TROPONINI, MYOGLOBIN in the last 168 hours.  Invalid input(s): CK ------------------------------------------------------------------------------------------------------------------ Invalid input(s): POCBNP   CBG: No results for input(s): GLUCAP in the last 168 hours.     Studies: Ct Head Wo Contrast  Result Date: 09/11/2016 CLINICAL DATA:  Altered mental status.  Headache.  Confusion. EXAM: CT HEAD WITHOUT CONTRAST TECHNIQUE: Contiguous axial images were obtained from the base of the skull through the vertex without intravenous contrast. COMPARISON:  None. FINDINGS: Brain: No evidence of acute infarction, hemorrhage, hydrocephalus, extra-axial collection or mass lesion/mass effect.  Normal cerebral volume. No white matter disease. Vascular: No hyperdense vessel or unexpected calcification. Skull: Normal. Negative for fracture or focal lesion. Sinuses/Orbits: No acute finding. Mild mucosal thickening LEFT division sphenoid. Other: None. IMPRESSION: Negative exam.  Findings discussed with ordering provider. Electronically Signed   By: Elsie StainJohn T Curnes M.D.   On: 09/11/2016 09:08   Dg Chest Portable 1 View  Result Date: 09/11/2016 CLINICAL DATA:  Altered mental status. EXAM: PORTABLE CHEST 1 VIEW COMPARISON:  None. FINDINGS: Poor inspiration. Borderline enlarged cardiac silhouette. Prominent pulmonary vasculature and interstitial markings, accentuated by the poor inspiration. Unremarkable bones. IMPRESSION: No acute abnormality. Electronically Signed   By: Beckie SaltsSteven  Reid M.D.   On:  09/11/2016 09:46      No results found for: HGBA1C Lab Results  Component Value Date   CREATININE 0.92 09/12/2016       Scheduled Meds: . acyclovir  10 mg/kg (Adjusted) Intravenous Q8H  . cefTRIAXone (ROCEPHIN)  IV  2 g Intravenous Q12H  . doxycycline (VIBRAMYCIN) IV  100 mg Intravenous Q12H  . lidocaine (PF)  10 mL Intradermal Once  . vancomycin  1,000 mg Intravenous Q8H   Continuous Infusions: . sodium chloride 75 mL/hr at 09/11/16 1515     LOS: 1 day    Time spent: >30 MINS    [facility] LPBROL,Danelly Hassinger  [facility] Pager [phone]. If 7PM-7AM, please contact night-coverage at [email or web address], password [facility] Center[facility]1 09/12/2016, 8:49 AM  LOS: 1 day

## 2016-09-12 NOTE — Evaluation (Signed)
Clinical/Bedside Swallow Evaluation Patient Details  Name: Kirtland BouchardRandy D Pafford MRN: [id number] Date of Birth: [birth year]  Today's Date: 09/12/2016 Time: SLP Start Time (ACUTE ONLY): 1308 SLP Stop Time (ACUTE ONLY): 1320 SLP Time Calculation (min) (ACUTE ONLY): 12 min  Past Medical History: History reviewed. No pertinent past medical history. Past Surgical History: History reviewed. No pertinent surgical history. HPI:  40 year old man presenting with an acute febrile illness with associated altered mental status. Per neuro, patient had no focal neurological deficits, and also had no clinical signs of meningeal irritation. CNS infection is unlikely, but cannot be completely ruled out at this point. Further w/u pending; Swallow eval ordered.    Assessment / Plan / Recommendation Clinical Impression  Pt presents with normal oropharyngeal swallow function marked by adequate mastication, brisk swallow response, no overt s/s of aspiration.  No needs identified.  Continue regular diet, thin liquids.  SLP services will sign off.     Aspiration Risk  No limitations    Diet Recommendation   regular, thin liquids       Other  Recommendations Oral Care Recommendations: Oral care BID   Follow up Recommendations    none    Frequency and Duration            Prognosis        Swallow Study   General Date of Onset: 09/11/16 HPI: 40 year old man presenting with an acute febrile illness with associated altered mental status. Per neuro, patient had no focal neurological deficits, and also had no clinical signs of meningeal irritation. CNS infection is unlikely, but cannot be completely ruled out at this point. Further w/u pending; Swallow eval ordered.  Type of Study: Bedside Swallow Evaluation Previous Swallow Assessment: no Diet Prior to this Study: Regular;Thin liquids Temperature Spikes Noted: No Respiratory Status: Room air History of Recent Intubation: No Behavior/Cognition:  Alert;Cooperative Oral Cavity Assessment: Within Functional Limits Oral Care Completed by SLP: No Oral Cavity - Dentition: Missing dentition Vision: Functional for self-feeding Self-Feeding Abilities: Able to feed self Patient Positioning: Upright in bed Baseline Vocal Quality: Normal Volitional Cough: Strong Volitional Swallow: Able to elicit    Oral/Motor/Sensory Function Overall Oral Motor/Sensory Function: Within functional limits   Ice Chips Ice chips: Within functional limits   Thin Liquid Thin Liquid: Within functional limits Presentation: Cup;Self Fed    Nectar Thick Nectar Thick Liquid: Not tested   Honey Thick Honey Thick Liquid: Not tested   Puree Puree: Within functional limits Presentation: Self Fed   Solid   GO   Solid: Within functional limits       Rhoderick Farrel L. Samson Fredericouture, [place]MA CCC/SLP Pager [phone]  Blenda MountsCouture, Aaronjames Kelsay Laurice 09/12/2016,1:23 PM

## 2016-09-12 NOTE — Procedures (Signed)
CLINICAL DATA: [Acute encephalopathy.] EXAM: DIAGNOSTIC LUMBAR PUNCTURE UNDER FLUOROSCOPIC GUIDANCE FLUOROSCOPY TIME: Fluoroscopy Time: [0 minutes, 15 seconds] Radiation Exposure Index (if provided by the fluoroscopic device): [N/A] Number of Acquired Spot Images: [0] PROCEDURE: I discussed the risks (including hemorrhage, infection, headache, and nerve damage, among others), benefits, and alternatives to fluoroscopically guided lumbar puncture with the patient and with his father.  In my opinion, the patient is not entirely of normal mental status despite the reported progression from yesterday.  Accordingly, the patient's father was used as the primary person to grant consent, although both the patient and his father elected for the patient to undergo the procedure.  We specifically discussed the high technical likelihood of success of the procedure.  Standard time-out was employed. Following sterile skin prep and local anesthetic administration consisting of 1 percent lidocaine, a 22 gauge spinal needle was advanced without difficulty into the thecal sac at the at the [L2-3] level. Clear CSF was returned.  Opening pressure was [28 cm of water], but was not measured with the patient prone rather than laying on his side ; in my judgment, based on the patient's body habitus, turning him up on this side on the fluoroscopy table would have been risky.  12 cc of clear CSF was collected. The needle was subsequently removed and the skin cleansed and bandaged. No immediate complications were observed.    IMPRESSION: [ 1. Technically successful fluoroscopically guided lumbar puncture at the L2-3 level, yielding 12 cc of clear CSF and an opening pressure of 28 cm of water.  ]

## 2016-09-12 NOTE — Progress Notes (Signed)
Critical white cell count on CSF of 330. Dr. Susie CassetteABrol aware. ID to consult.

## 2016-09-12 NOTE — Consult Note (Addendum)
[facility] for Infectious Disease  Date of Admission:  09/11/2016  Date of Consult:  09/12/2016  Reason for Consult: Meningitis Referring Physician: Allyson Sabal  Impression/Recommendation Meningitis, lymphocytic  Check CSF HSV Check RMSF? Seem sunlikely, he does not have spots.  Check Ehrlichia Await CSF and Blood Cxs Check HIV Ab and PCR Check Enterovirus PCR on CSF Will stop vanco  Comment He has aseptic meningitis. Many possible causes- viral (flu, HIV, HSV, enterovirus) as well as NSAIDS come to fore in his story.  Ehrlichia could do this as well.    Thank you so much for this interesting consult,   Bobby Rumpf (pager) [phone] [email or web address]  Ethan Logan is an 40 y.o. male.  HPI: 40 yo M with 1 week hx of URI sx, previous outdoor exposure 1 week pta. For his URI he took NSAIDS and Aleve Cold. He has not been on anbx recently. No sick exposures. No bug bites, no ticks. He has 1 outdoor dog.  He comes to ED on 12-3 with worsening headache, n/v followed by seizure and LOC. In ED he was found to have temp 101.7 and normal head CT. He was sent to [facility] from [facility] for further eval.   He was started on vancomycin, acyclovir, and doxycycline He underwent LP this AM showing 330 WBC (97% L), Prot 118, Glc 70. Crypto Ag (-).   Past Medical History:  Diagnosis Date  . Pneumonia 1980    Past Surgical History:  Procedure Laterality Date  . CYST EXCISION Right ~ 2000   "big one on my lower leg"     Allergies  Allergen Reactions  . Aspirin Swelling  . Penicillins Rash    Medications:  Scheduled: . acyclovir  10 mg/kg (Adjusted) Intravenous Q8H  . cefTRIAXone (ROCEPHIN)  IV  2 g Intravenous Q12H  . doxycycline (VIBRAMYCIN) IV  100 mg Intravenous Q12H  . enoxaparin (LOVENOX) injection  40 mg Subcutaneous Q24H  . vancomycin  1,000 mg Intravenous Q8H    Abtx:  Anti-infectives    Start     Dose/Rate Route Frequency Ordered Stop   09/12/16 0030   vancomycin (VANCOCIN) IVPB 1000 mg/200 mL premix     1,000 mg 200 mL/hr over 60 Minutes Intravenous Every 8 hours 09/11/16 1532     09/11/16 2200  cefTRIAXone (ROCEPHIN) 2 g in dextrose 5 % 50 mL IVPB     2 g 100 mL/hr over 30 Minutes Intravenous Every 12 hours 09/11/16 1458     09/11/16 2200  doxycycline (VIBRAMYCIN) 100 mg in dextrose 5 % 250 mL IVPB     100 mg 125 mL/hr over 120 Minutes Intravenous Every 12 hours 09/11/16 1458     09/11/16 1830  acyclovir (ZOVIRAX) 935 mg in dextrose 5 % 150 mL IVPB     10 mg/kg  93.3 kg (Adjusted) 168.7 mL/hr over 60 Minutes Intravenous Every 8 hours 09/11/16 1532     09/11/16 1545  vancomycin (VANCOCIN) 1,500 mg in sodium chloride 0.9 % 500 mL IVPB     1,500 mg 250 mL/hr over 120 Minutes Intravenous  Once 09/11/16 1532 09/11/16 1853   09/11/16 1200  doxycycline (VIBRAMYCIN) 100 mg in dextrose 5 % 250 mL IVPB     100 mg 125 mL/hr over 120 Minutes Intravenous  Once 09/11/16 1136 09/11/16 1402   09/11/16 1000  acyclovir (ZOVIRAX) 800 mg in dextrose 5 % 150 mL IVPB     10 mg/kg  79.9 kg (Ideal) 166 mL/hr  over 60 Minutes Intravenous  Once 09/11/16 0943 09/11/16 1146   09/11/16 0945  cefTRIAXone (ROCEPHIN) 2 g in dextrose 5 % 50 mL IVPB     2 g 100 mL/hr over 30 Minutes Intravenous  Once 09/11/16 0943 09/11/16 1027   09/11/16 0945  vancomycin (VANCOCIN) IVPB 1000 mg/200 mL premix     1,000 mg 200 mL/hr over 60 Minutes Intravenous  Once 09/11/16 0943 09/11/16 1129      Total days of antibiotics: 1 vanco/ceftriaxone/doxy/acyclovir          Social History:  reports that he has never smoked. His smokeless tobacco use includes Snuff. He reports that he drinks alcohol. He reports that he does not use drugs.  History reviewed. No pertinent family history.  General ROS: + n/v, +headache. no neck stiffness. no photophbia. no rashes. see HPI.   Blood pressure 109/74, pulse 89, temperature 97.4 F (36.3 C), temperature source Oral, resp. rate 16, height  _0  (1.854 m), weight 134.1 kg (295 lb 9.6 oz), SpO2 97 %. General appearance: alert, cooperative and no distress Eyes: negative findings: conjunctivae and sclerae normal and pupils equal, round, reactive to light and accomodation Throat: lips, mucosa, and tongue normal; teeth and gums normal Neck: no adenopathy, supple, symmetrical, trachea midline and FROM, no meningisumus.  Lungs: clear to auscultation bilaterally Heart: regular rate and rhythm Abdomen: normal findings: bowel sounds normal and soft, non-tender Extremities: edema none Skin: no rashes or dry Neurologic: Mental status: Alert, oriented, thought content appropriate Cranial nerves: normal Motor: grossly normal Coordination: normal   Results for orders placed or performed during the hospital encounter of 09/11/16 (from the past 48 hour(s))  CBC with Differential/Platelet     Status: None   Collection Time: 09/11/16  8:38 AM  Result Value Ref Range   WBC 7.9 4.0 - 10.5 K/uL   RBC 4.98 4.22 - 5.81 MIL/uL   Hemoglobin 14.7 13.0 - 17.0 g/dL   HCT 43.0 39.0 - 52.0 %   MCV 86.3 78.0 - 100.0 fL   MCH 29.5 26.0 - 34.0 pg   MCHC 34.2 30.0 - 36.0 g/dL   RDW 13.0 11.5 - 15.5 %   Platelets 193 150 - 400 K/uL   Neutrophils Relative % 66 %   Neutro Abs 5.2 1.7 - 7.7 K/uL   Lymphocytes Relative 27 %   Lymphs Abs 2.1 0.7 - 4.0 K/uL   Monocytes Relative 5 %   Monocytes Absolute 0.4 0.1 - 1.0 K/uL   Eosinophils Relative 2 %   Eosinophils Absolute 0.2 0.0 - 0.7 K/uL   Basophils Relative 0 %   Basophils Absolute 0.0 0.0 - 0.1 K/uL  Comprehensive metabolic panel     Status: Abnormal   Collection Time: 09/11/16  8:38 AM  Result Value Ref Range   Sodium 139 135 - 145 mmol/L   Potassium 3.7 3.5 - 5.1 mmol/L   Chloride 105 101 - 111 mmol/L   CO2 24 22 - 32 mmol/L   Glucose, Bld 198 (H) 65 - 99 mg/dL   BUN 14 6 - 20 mg/dL   Creatinine, Ser 0.94 0.61 - 1.24 mg/dL   Calcium 9.6 8.9 - 10.3 mg/dL   Total Protein 7.7 6.5 - 8.1 g/dL     Albumin 4.5 3.5 - 5.0 g/dL   AST 25 15 - 41 U/L   ALT 22 17 - 63 U/L   Alkaline Phosphatase 48 38 - 126 U/L   Total Bilirubin 0.6 0.3 - 1.2 mg/dL  GFR calc non Af Amer >60 >60 mL/min   GFR calc Af Amer >60 >60 mL/min    Comment: (NOTE) The eGFR has been calculated using the CKD EPI equation. This calculation has not been validated in all clinical situations. eGFR's persistently <60 mL/min signify possible Chronic Kidney Disease.    Anion gap 10 5 - 15  Acetaminophen level     Status: Abnormal   Collection Time: 09/11/16  8:40 AM  Result Value Ref Range   Acetaminophen (Tylenol), Serum <10 (L) 10 - 30 ug/mL    Comment:        THERAPEUTIC CONCENTRATIONS VARY SIGNIFICANTLY. A RANGE OF 10-30 ug/mL MAY BE AN EFFECTIVE CONCENTRATION FOR MANY PATIENTS. HOWEVER, SOME ARE BEST TREATED AT CONCENTRATIONS OUTSIDE THIS RANGE. ACETAMINOPHEN CONCENTRATIONS >150 ug/mL AT 4 HOURS AFTER INGESTION AND >50 ug/mL AT 12 HOURS AFTER INGESTION ARE OFTEN ASSOCIATED WITH TOXIC REACTIONS.   Salicylate level     Status: None   Collection Time: 09/11/16  8:40 AM  Result Value Ref Range   Salicylate Lvl <3.0 2.8 - 30.0 mg/dL  Blood culture (routine x 2)     Status: None (Preliminary result)   Collection Time: 09/11/16  8:42 AM  Result Value Ref Range   Specimen Description BLOOD LEFT ANTECUBITAL    Special Requests BOTTLES DRAWN AEROBIC AND ANAEROBIC 6CC    Culture PENDING    Report Status PENDING   I-stat chem 8, ed     Status: Abnormal   Collection Time: 09/11/16  8:50 AM  Result Value Ref Range   Sodium 140 135 - 145 mmol/L   Potassium 3.7 3.5 - 5.1 mmol/L   Chloride 103 101 - 111 mmol/L   BUN 14 6 - 20 mg/dL   Creatinine, Ser 0.90 0.61 - 1.24 mg/dL   Glucose, Bld 189 (H) 65 - 99 mg/dL   Calcium, Ion 1.18 1.15 - 1.40 mmol/L   TCO2 23 0 - 100 mmol/L   Hemoglobin 14.6 13.0 - 17.0 g/dL   HCT 43.0 39.0 - 52.0 %  I-Stat CG4 Lactic Acid, ED     Status: Abnormal   Collection Time:  09/11/16  8:51 AM  Result Value Ref Range   Lactic Acid, Venous 3.12 (HH) 0.5 - 1.9 mmol/L   Comment NOTIFIED PHYSICIAN   I-stat troponin, ED     Status: None   Collection Time: 09/11/16  8:55 AM  Result Value Ref Range   Troponin i, poc 0.00 0.00 - 0.08 ng/mL   Comment 3            Comment: Due to the release kinetics of cTnI, a negative result within the first hours of the onset of symptoms does not rule out myocardial infarction with certainty. If myocardial infarction is still suspected, repeat the test at appropriate intervals.   Urinalysis, Routine w reflex microscopic (not at [facility])     Status: Abnormal   Collection Time: 09/11/16  9:03 AM  Result Value Ref Range   Color, Urine YELLOW YELLOW   APPearance CLEAR CLEAR   Specific Gravity, Urine 1.015 1.005 - 1.030   pH 7.0 5.0 - 8.0   Glucose, UA NEGATIVE NEGATIVE mg/dL   Hgb urine dipstick NEGATIVE NEGATIVE   Bilirubin Urine NEGATIVE NEGATIVE   Ketones, ur TRACE (A) NEGATIVE mg/dL   Protein, ur TRACE (A) NEGATIVE mg/dL   Nitrite NEGATIVE NEGATIVE   Leukocytes, UA NEGATIVE NEGATIVE  Rapid urine drug screen (hospital performed)     Status: Abnormal  Collection Time: 09/11/16  9:03 AM  Result Value Ref Range   Opiates NONE DETECTED NONE DETECTED   Cocaine NONE DETECTED NONE DETECTED   Benzodiazepines NONE DETECTED NONE DETECTED   Amphetamines POSITIVE (A) NONE DETECTED   Tetrahydrocannabinol NONE DETECTED NONE DETECTED   Barbiturates NONE DETECTED NONE DETECTED    Comment:        DRUG SCREEN FOR MEDICAL PURPOSES ONLY.  IF CONFIRMATION IS NEEDED FOR ANY PURPOSE, NOTIFY LAB WITHIN 5 DAYS.        LOWEST DETECTABLE LIMITS FOR URINE DRUG SCREEN Drug Class       Cutoff (ng/mL) Amphetamine      1000 Barbiturate      200 Benzodiazepine   211 Tricyclics       941 Opiates          300 Cocaine          300 THC              50   Urine microscopic-add on     Status: Abnormal   Collection Time: 09/11/16  9:03 AM  Result  Value Ref Range   Squamous Epithelial / LPF 0-5 (A) NONE SEEN   WBC, UA 0-5 0 - 5 WBC/hpf   RBC / HPF 0-5 0 - 5 RBC/hpf   Bacteria, UA FEW (A) NONE SEEN   Urine-Other MUCOUS PRESENT   Blood culture (routine x 2)     Status: None (Preliminary result)   Collection Time: 09/11/16 10:27 AM  Result Value Ref Range   Specimen Description BLOOD BLOOD LEFT FOREARM    Special Requests BOTTLES DRAWN AEROBIC AND ANAEROBIC 6CC    Culture PENDING    Report Status PENDING   I-Stat CG4 Lactic Acid, ED     Status: Abnormal   Collection Time: 09/11/16 12:24 PM  Result Value Ref Range   Lactic Acid, Venous 2.88 (HH) 0.5 - 1.9 mmol/L   Comment NOTIFIED PHYSICIAN   Comprehensive metabolic panel     Status: Abnormal   Collection Time: 09/12/16  6:34 AM  Result Value Ref Range   Sodium 138 135 - 145 mmol/L   Potassium 3.6 3.5 - 5.1 mmol/L   Chloride 106 101 - 111 mmol/L   CO2 24 22 - 32 mmol/L   Glucose, Bld 124 (H) 65 - 99 mg/dL   BUN 10 6 - 20 mg/dL   Creatinine, Ser 0.92 0.61 - 1.24 mg/dL   Calcium 9.0 8.9 - 10.3 mg/dL   Total Protein 6.0 (L) 6.5 - 8.1 g/dL   Albumin 3.6 3.5 - 5.0 g/dL   AST 19 15 - 41 U/L   ALT 18 17 - 63 U/L   Alkaline Phosphatase 41 38 - 126 U/L   Total Bilirubin 0.9 0.3 - 1.2 mg/dL   GFR calc non Af Amer >60 >60 mL/min   GFR calc Af Amer >60 >60 mL/min    Comment: (NOTE) The eGFR has been calculated using the CKD EPI equation. This calculation has not been validated in all clinical situations. eGFR's persistently <60 mL/min signify possible Chronic Kidney Disease.    Anion gap 8 5 - 15  CBC     Status: Abnormal   Collection Time: 09/12/16  6:34 AM  Result Value Ref Range   WBC 7.0 4.0 - 10.5 K/uL   RBC 4.35 4.22 - 5.81 MIL/uL   Hemoglobin 12.4 (L) 13.0 - 17.0 g/dL   HCT 37.7 (L) 39.0 - 52.0 %   MCV 86.7 78.0 -  100.0 fL   MCH 28.5 26.0 - 34.0 pg   MCHC 32.9 30.0 - 36.0 g/dL   RDW 13.5 11.5 - 15.5 %   Platelets 150 150 - 400 K/uL  Glucose, CSF     Status: None    Collection Time: 09/12/16  9:36 AM  Result Value Ref Range   Glucose, CSF 70 40 - 70 mg/dL  Protein, CSF     Status: Abnormal   Collection Time: 09/12/16  9:36 AM  Result Value Ref Range   Total  Protein, CSF 118 (H) 15 - 45 mg/dL  CSF cell count with differential     Status: Abnormal   Collection Time: 09/12/16  9:36 AM  Result Value Ref Range   Tube # 3    Color, CSF COLORLESS COLORLESS   Appearance, CSF CLEAR CLEAR   Supernatant NOT INDICATED    RBC Count, CSF 0 0 /cu mm   WBC, CSF 330 (HH) 0 - 5 /cu mm    Comment: CRITICAL RESULT CALLED TO, READ BACK BY AND VERIFIED WITH: GINGER GLEASON,RN AT 1100 09/12/16 BY ZBEECH.    Segmented Neutrophils-CSF 0 0 - 6 %   Lymphs, CSF 97 (H) 40 - 80 %   Monocyte-Macrophage-Spinal Fluid 3 (L) 15 - 45 %   Eosinophils, CSF 0 0 - 1 %   Other Cells, CSF 0   CSF culture     Status: None (Preliminary result)   Collection Time: 09/12/16  9:36 AM  Result Value Ref Range   Specimen Description CSF    Special Requests TUBE 2    Gram Stain      WBC PRESENT, PREDOMINANTLY MONONUCLEAR NO ORGANISMS SEEN    Culture PENDING    Report Status PENDING   Cryptococcal antigen, CSF     Status: None   Collection Time: 09/12/16  9:36 AM  Result Value Ref Range   Crypto Ag NEGATIVE NEGATIVE   Cryptococcal Ag Titer NOT INDICATED NOT INDICATED  Influenza panel by PCR (type A & B, H1N1)     Status: None   Collection Time: 09/12/16 11:39 AM  Result Value Ref Range   Influenza A By PCR NEGATIVE NEGATIVE   Influenza B By PCR NEGATIVE NEGATIVE    Comment: (NOTE) The Xpert Xpress Flu assay is intended as an aid in the diagnosis of  influenza and should not be used as a sole basis for treatment.  This  assay is FDA approved for nasopharyngeal swab specimens only. Nasal  washings and aspirates are unacceptable for Xpert Xpress Flu testing.       Component Value Date/Time   SDES CSF 09/12/2016 0936   SPECREQUEST TUBE 2 09/12/2016 0936   CULT PENDING  09/12/2016 0936   REPTSTATUS PENDING 09/12/2016 0936   Ct Head Wo Contrast  Result Date: 09/11/2016 CLINICAL DATA:  Altered mental status.  Headache.  Confusion. EXAM: CT HEAD WITHOUT CONTRAST TECHNIQUE: Contiguous axial images were obtained from the base of the skull through the vertex without intravenous contrast. COMPARISON:  None. FINDINGS: Brain: No evidence of acute infarction, hemorrhage, hydrocephalus, extra-axial collection or mass lesion/mass effect. Normal cerebral volume. No white matter disease. Vascular: No hyperdense vessel or unexpected calcification. Skull: Normal. Negative for fracture or focal lesion. Sinuses/Orbits: No acute finding. Mild mucosal thickening LEFT division sphenoid. Other: None. IMPRESSION: Negative exam.  Findings discussed with ordering provider. Electronically Signed   By: Staci Righter M.D.   On: 09/11/2016 09:08   Dg Chest Portable 1 View  Result Date: 09/11/2016 CLINICAL DATA:  Altered mental status. EXAM: PORTABLE CHEST 1 VIEW COMPARISON:  None. FINDINGS: Poor inspiration. Borderline enlarged cardiac silhouette. Prominent pulmonary vasculature and interstitial markings, accentuated by the poor inspiration. Unremarkable bones. IMPRESSION: No acute abnormality. Electronically Signed   By: Claudie Revering M.D.   On: 09/11/2016 09:46   Dg Fluoro Guide Lumbar Puncture  Result Date: 09/12/2016 CLINICAL DATA:  Acute encephalopathy. EXAM: DIAGNOSTIC LUMBAR PUNCTURE UNDER FLUOROSCOPIC GUIDANCE FLUOROSCOPY TIME:  Fluoroscopy Time:  0 minutes, 15 seconds Radiation Exposure Index (if provided by the fluoroscopic device): N/A Number of Acquired Spot Images: 0 PROCEDURE: I discussed the risks (including hemorrhage, infection, headache, and nerve damage, among others), benefits, and alternatives to fluoroscopically guided lumbar puncture with the patient and with his father. In my opinion, the patient is not entirely of normal mental status despite the reported progression from  yesterday. Accordingly, the patient's father was used as the primary person to grant consent, although both the patient and his father elected for the patient to undergo the procedure. We specifically discussed the high technical likelihood of success of the procedure. Standard time-out was employed. Following sterile skin prep and local anesthetic administration consisting of 1 percent lidocaine, a 22 gauge spinal needle was advanced without difficulty into the thecal sac at the at the L2-3 level. Clear CSF was returned. Opening pressure was 28 cm of water, but was not measured with the patient prone rather than laying on his side ; in my judgment, based on the patient's body habitus, turning him up on this side on the fluoroscopy table would have been risky. 12 cc of clear CSF was collected. The needle was subsequently removed and the skin cleansed and bandaged. No immediate complications were observed. IMPRESSION: 1. Technically successful fluoroscopically guided lumbar puncture at the L2-3 level, yielding 12 cc of clear CSF and an opening pressure of 28 cm of water. Electronically Signed   By: Van Clines M.D.   On: 09/12/2016 09:52   Recent Results (from the past 240 hour(s))  Blood culture (routine x 2)     Status: None (Preliminary result)   Collection Time: 09/11/16  8:42 AM  Result Value Ref Range Status   Specimen Description BLOOD LEFT ANTECUBITAL  Final   Special Requests BOTTLES DRAWN AEROBIC AND ANAEROBIC 6CC  Final   Culture PENDING  Incomplete   Report Status PENDING  Incomplete  Blood culture (routine x 2)     Status: None (Preliminary result)   Collection Time: 09/11/16 10:27 AM  Result Value Ref Range Status   Specimen Description BLOOD BLOOD LEFT FOREARM  Final   Special Requests BOTTLES DRAWN AEROBIC AND ANAEROBIC 6CC  Final   Culture PENDING  Incomplete   Report Status PENDING  Incomplete  CSF culture     Status: None (Preliminary result)   Collection Time: 09/12/16   9:36 AM  Result Value Ref Range Status   Specimen Description CSF  Final   Special Requests TUBE 2  Final   Gram Stain   Final    WBC PRESENT, PREDOMINANTLY MONONUCLEAR NO ORGANISMS SEEN    Culture PENDING  Incomplete   Report Status PENDING  Incomplete      09/12/2016, 6:46 PM     LOS: 1 day    Records and images were personally reviewed where available.

## 2016-09-13 DIAGNOSIS — A872 Lymphocytic choriomeningitis: Secondary | ICD-10-CM

## 2016-09-13 DIAGNOSIS — R410 Disorientation, unspecified: Secondary | ICD-10-CM

## 2016-09-13 LAB — CBC
HCT: 39.2 % (ref 39.0–52.0)
Hemoglobin: 13.2 g/dL (ref 13.0–17.0)
MCH: 29.1 pg (ref 26.0–34.0)
MCHC: 33.7 g/dL (ref 30.0–36.0)
MCV: 86.5 fL (ref 78.0–100.0)
PLATELETS: 161 10*3/uL (ref 150–400)
RBC: 4.53 MIL/uL (ref 4.22–5.81)
RDW: 13.3 % (ref 11.5–15.5)
WBC: 5.7 10*3/uL (ref 4.0–10.5)

## 2016-09-13 LAB — COMPREHENSIVE METABOLIC PANEL
ALT: 18 U/L (ref 17–63)
AST: 19 U/L (ref 15–41)
Albumin: 3.8 g/dL (ref 3.5–5.0)
Alkaline Phosphatase: 42 U/L (ref 38–126)
Anion gap: 10 (ref 5–15)
BUN: 7 mg/dL (ref 6–20)
CHLORIDE: 103 mmol/L (ref 101–111)
CO2: 24 mmol/L (ref 22–32)
CREATININE: 0.87 mg/dL (ref 0.61–1.24)
Calcium: 9.3 mg/dL (ref 8.9–10.3)
GFR calc non Af Amer: >60 mL/min (ref >60)
Glucose, Bld: 112 mg/dL — ABNORMAL HIGH (ref 65–99)
Potassium: 3.7 mmol/L (ref 3.5–5.1)
SODIUM: 137 mmol/L (ref 135–145)
Total Bilirubin: 0.5 mg/dL (ref 0.3–1.2)
Total Protein: 6.6 g/dL (ref 6.5–8.1)

## 2016-09-13 LAB — HIV ANTIBODY (ROUTINE TESTING W REFLEX): HIV Screen 4th Generation wRfx: NONREACTIVE

## 2016-09-13 LAB — ROCKY MTN SPOTTED FVR ABS PNL(IGG+IGM)
RMSF IgG: POSITIVE — AB
RMSF IgM: 0.64 index (ref 0.00–0.89)

## 2016-09-13 LAB — HERPES SIMPLEX VIRUS(HSV) DNA BY PCR
HSV 1 DNA: NEGATIVE
HSV 2 DNA: NEGATIVE

## 2016-09-13 LAB — RMSF, IGG, IFA: RMSF, IGG, IFA: <1:64 {titer}

## 2016-09-13 NOTE — Progress Notes (Signed)
INFECTIOUS DISEASE PROGRESS NOTE  ID: Kirtland BouchardRandy D Sagraves is a 40 y.o. male with  Active Problems:   Acute encephalopathy   Headache  Subjective: Feels better than he has in weeks.   Abtx:  Anti-infectives    Start     Dose/Rate Route Frequency Ordered Stop   09/12/16 0030  vancomycin (VANCOCIN) IVPB 1000 mg/200 mL premix  Status:  Discontinued     1,000 mg 200 mL/hr over 60 Minutes Intravenous Every 8 hours 09/11/16 1532 09/12/16 1911   09/11/16 2200  cefTRIAXone (ROCEPHIN) 2 g in dextrose 5 % 50 mL IVPB     2 g 100 mL/hr over 30 Minutes Intravenous Every 12 hours 09/11/16 1458     09/11/16 2200  doxycycline (VIBRAMYCIN) 100 mg in dextrose 5 % 250 mL IVPB     100 mg 125 mL/hr over 120 Minutes Intravenous Every 12 hours 09/11/16 1458     09/11/16 1830  acyclovir (ZOVIRAX) 935 mg in dextrose 5 % 150 mL IVPB     10 mg/kg  93.3 kg (Adjusted) 168.7 mL/hr over 60 Minutes Intravenous Every 8 hours 09/11/16 1532     09/11/16 1545  vancomycin (VANCOCIN) 1,500 mg in sodium chloride 0.9 % 500 mL IVPB     1,500 mg 250 mL/hr over 120 Minutes Intravenous  Once 09/11/16 1532 09/11/16 1853   09/11/16 1200  doxycycline (VIBRAMYCIN) 100 mg in dextrose 5 % 250 mL IVPB     100 mg 125 mL/hr over 120 Minutes Intravenous  Once 09/11/16 1136 09/11/16 1402   09/11/16 1000  acyclovir (ZOVIRAX) 800 mg in dextrose 5 % 150 mL IVPB     10 mg/kg  79.9 kg (Ideal) 166 mL/hr over 60 Minutes Intravenous  Once 09/11/16 0943 09/11/16 1146   09/11/16 0945  cefTRIAXone (ROCEPHIN) 2 g in dextrose 5 % 50 mL IVPB     2 g 100 mL/hr over 30 Minutes Intravenous  Once 09/11/16 0943 09/11/16 1027   09/11/16 0945  vancomycin (VANCOCIN) IVPB 1000 mg/200 mL premix     1,000 mg 200 mL/hr over 60 Minutes Intravenous  Once 09/11/16 0943 09/11/16 1129      Medications:  Scheduled: . acyclovir  10 mg/kg (Adjusted) Intravenous Q8H  . cefTRIAXone (ROCEPHIN)  IV  2 g Intravenous Q12H  . doxycycline (VIBRAMYCIN) IV  100 mg  Intravenous Q12H  . enoxaparin (LOVENOX) injection  40 mg Subcutaneous Q24H    Objective: Vital signs in last 24 hours: Temp:  [97.7 F (36.5 C)-98.2 F (36.8 C)] 98 F (36.7 C) (12/05 1510) Pulse Rate:  [62-80] 80 (12/05 1510) Resp:  [18-20] 18 (12/05 1510) BP: (102-130)/(62-77) 119/62 (12/05 1510) SpO2:  [96 %-97 %] 97 % (12/05 1510)   General appearance: alert, cooperative and no distress Neck: FROM  Lab Results  Recent Labs  09/12/16 0634 09/13/16 0521  WBC 7.0 5.7  HGB 12.4* 13.2  HCT 37.7* 39.2  NA 138 137  K 3.6 3.7  CL 106 103  CO2 24 24  BUN 10 7  CREATININE 0.92 0.87   Liver Panel  Recent Labs  09/12/16 0634 09/13/16 0521  PROT 6.0* 6.6  ALBUMIN 3.6 3.8  AST 19 19  ALT 18 18  ALKPHOS 41 42  BILITOT 0.9 0.5   Sedimentation Rate No results for input(s): ESRSEDRATE in the last 72 hours. C-Reactive Protein No results for input(s): CRP in the last 72 hours.  Microbiology: Recent Results (from the past 240 hour(s))  Blood culture (  routine x 2)     Status: None (Preliminary result)   Collection Time: 09/11/16  8:42 AM  Result Value Ref Range Status   Specimen Description BLOOD LEFT ANTECUBITAL  Final   Special Requests BOTTLES DRAWN AEROBIC AND ANAEROBIC 6CC  Final   Culture PENDING  Incomplete   Report Status PENDING  Incomplete  Blood culture (routine x 2)     Status: None (Preliminary result)   Collection Time: 09/11/16 10:27 AM  Result Value Ref Range Status   Specimen Description BLOOD BLOOD LEFT FOREARM  Final   Special Requests BOTTLES DRAWN AEROBIC AND ANAEROBIC 6CC  Final   Culture PENDING  Incomplete   Report Status PENDING  Incomplete  CSF culture     Status: None (Preliminary result)   Collection Time: 09/12/16  9:36 AM  Result Value Ref Range Status   Specimen Description CSF  Final   Special Requests TUBE 2  Final   Gram Stain   Final    WBC PRESENT, PREDOMINANTLY MONONUCLEAR NO ORGANISMS SEEN    Culture NO GROWTH 1 DAY   Final   Report Status PENDING  Incomplete    Studies/Results: Dg Fluoro Guide Lumbar Puncture  Result Date: 09/12/2016 CLINICAL DATA:  Acute encephalopathy. EXAM: DIAGNOSTIC LUMBAR PUNCTURE UNDER FLUOROSCOPIC GUIDANCE FLUOROSCOPY TIME:  Fluoroscopy Time:  0 minutes, 15 seconds Radiation Exposure Index (if provided by the fluoroscopic device): N/A Number of Acquired Spot Images: 0 PROCEDURE: I discussed the risks (including hemorrhage, infection, headache, and nerve damage, among others), benefits, and alternatives to fluoroscopically guided lumbar puncture with the patient and with his father. In my opinion, the patient is not entirely of normal mental status despite the reported progression from yesterday. Accordingly, the patient's father was used as the primary person to grant consent, although both the patient and his father elected for the patient to undergo the procedure. We specifically discussed the high technical likelihood of success of the procedure. Standard time-out was employed. Following sterile skin prep and local anesthetic administration consisting of 1 percent lidocaine, a 22 gauge spinal needle was advanced without difficulty into the thecal sac at the at the L2-3 level. Clear CSF was returned. Opening pressure was 28 cm of water, but was not measured with the patient prone rather than laying on his side ; in my judgment, based on the patient's body habitus, turning him up on this side on the fluoroscopy table would have been risky. 12 cc of clear CSF was collected. The needle was subsequently removed and the skin cleansed and bandaged. No immediate complications were observed. IMPRESSION: 1. Technically successful fluoroscopically guided lumbar puncture at the L2-3 level, yielding 12 cc of clear CSF and an opening pressure of 28 cm of water. Electronically Signed   By: Gaylyn RongWalter  Liebkemann M.D.   On: 09/12/2016 09:52     Assessment/Plan: Meningitis, lymphocytic  Check CSF HSV Check  Ehrlichia Await CSF and Blood Cxs Check HIV PCR Check Enterovirus PCR on CSF Check arbovirus panel Check CSF EBV PCR  RMSF (-)  HIV Ab (-)  Day 2 doxy/ceftraixone/acyclovir         Johny SaxJeffrey Demetries [facility] (pager) [phone] [email or web address] 09/13/2016, 4:05 PM  LOS: 2 days

## 2016-09-13 NOTE — Progress Notes (Signed)
[facility] PROGRESS NOTE  Ethan Logan WUJ:[id number] DOB: August 07, 1976 DOA: 09/11/2016   PCP: [facility]     Assessment/Plan: Active Problems:   Acute encephalopathy   Headache   40 y.o. male without PMH. He has been having URI symptoms for the past week. History obtained by parents at bedside as patient is too confused to provide any. They state he has been taking Aleve Cold for nasal congestion. He has taken 20 tablets over the past 4 days. 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    He was found sitting slumped over in the couch, with jerky movements/tremors and unable to respond to questions. EMS brought him to the ED for evaluation  UDS positive for amphetamines (likely psuedoephedrine effect), CXR with NAD, CT Head negative, EKG no acute changes. Patient evaluated by neurohospitalist. patient transferred to [facility] for further evaluation  Assessment and plan  Acute infectious Encephalopathy/ /encephalitis/meningitis -DDx remains broad: meningitis/encephalitis (altho strange that no fever), seizure, organic brain disease like CVA not excluded altho very unlikely, tick-borne diseases possible given frequent hunting, although symptoms   not  typical and does not have the usual lab abnormalities that accompany it (leukopenia, thrombocytopenia, hyponatremia, transaminitis) or rash. ?pseudoephedrine effect especially given jerky movements and tremors, altho not sure how this would explain his encephalopathy.  H Amy nonreactive, cryptococcal antigen negative, influenza PCR negative, blood culture still pending, CSF culture pending Continue acyclovir Rocephin, doxycycline and acyclovir, vancomycin discontinued Patient has 330 white blood cells in CSF, total protein 118 Infectious disease consult, Many possible causes- viral (flu, HIV, HSV, enterovirus) as well as NSAIDS  -Place on doxycycline for possible tick-borne diseases. RMSF and  Ehrlichia titers, check Lyme serology  UDS positive for amphetamines No indication for MRI at this time we will cancel, no indication for EEG   DVT prophylaxsis  Lovenox  Code Status:  Full code   Family Communication: Discussed in detail with the patient, all imaging results, lab results explained to the patient   Disposition Plan: 2- 3 days     Consultants:  Neurology  Infectious disease  Procedures:  LP  Antibiotics: Anti-infectives    Start     Dose/Rate Route Frequency Ordered Stop   09/12/16 0030  vancomycin (VANCOCIN) IVPB 1000 mg/200 mL premix  Status:  Discontinued     1,000 mg 200 mL/hr over 60 Minutes Intravenous Every 8 hours 09/11/16 1532 09/12/16 1911   09/11/16 2200  cefTRIAXone (ROCEPHIN) 2 g in dextrose 5 % 50 mL IVPB     2 g 100 mL/hr over 30 Minutes Intravenous Every 12 hours 09/11/16 1458     09/11/16 2200  doxycycline (VIBRAMYCIN) 100 mg in dextrose 5 % 250 mL IVPB     100 mg 125 mL/hr over 120 Minutes Intravenous Every 12 hours 09/11/16 1458     09/11/16 1830  acyclovir (ZOVIRAX) 935 mg in dextrose 5 % 150 mL IVPB     10 mg/kg  93.3 kg (Adjusted) 168.7 mL/hr over 60 Minutes Intravenous Every 8 hours 09/11/16 1532     09/11/16 1545  vancomycin (VANCOCIN) 1,500 mg in sodium chloride 0.9 % 500 mL IVPB     1,500 mg 250 mL/hr over 120 Minutes Intravenous  Once 09/11/16 1532 09/11/16 1853   09/11/16 1200  doxycycline (VIBRAMYCIN) 100 mg in dextrose 5 % 250 mL IVPB     100 mg 125 mL/hr over 120 Minutes Intravenous  Once  09/11/16 1136 09/11/16 1402   09/11/16 1000  acyclovir (ZOVIRAX) 800 mg in dextrose 5 % 150 mL IVPB     10 mg/kg  79.9 kg (Ideal) 166 mL/hr over 60 Minutes Intravenous  Once 09/11/16 0943 09/11/16 1146   09/11/16 0945  cefTRIAXone (ROCEPHIN) 2 g in dextrose 5 % 50 mL IVPB     2 g 100 mL/hr over 30 Minutes Intravenous  Once 09/11/16 0943 09/11/16 1027   09/11/16 0945  vancomycin (VANCOCIN) IVPB 1000 mg/200 mL premix     1,000  mg 200 mL/hr over 60 Minutes Intravenous  Once 09/11/16 [id number] 09/11/16 1129         HPI/Subjective: Patient states that he is feeling 100% better. He has no complaints at this time  Objective: Vitals:   09/12/16 0703 09/12/16 1333 09/12/16 2135 09/13/16 0519  BP: 91/72 109/74 102/70 130/77  Pulse: 71 89 70 62  Resp: 20 16 20 20   Temp: 98.2 F (36.8 C) 97.4 F (36.3 C) 98.2 F (36.8 C) 97.7 F (36.5 C)  TempSrc: Oral Oral Oral Oral  SpO2: 96% 97% 97% 96%  Weight:      Height:        Intake/Output Summary (Last 24 hours) at 09/13/16 1201 Last data filed at 09/13/16 0830  Gross per 24 hour  Intake           3531.1 ml  Output             3230 ml  Net            301.1 ml    Exam:  Examination:  General exam: Appears calm and comfortable  Respiratory system: Clear to auscultation. Respiratory effort normal. Cardiovascular system: S1 & S2 heard, RRR. No JVD, murmurs, rubs, gallops or clicks. No pedal edema. Gastrointestinal system: Abdomen is nondistended, soft and nontender. No organomegaly or masses felt. Normal bowel sounds heard. Central nervous system: Alert and oriented. No focal neurological deficits. Extremities: Symmetric 5 x 5 power. Skin: No rashes, lesions or ulcers Psychiatry: Judgement and insight appear normal. Mood & affect appropriate.     Data Reviewed: I have personally reviewed following labs and imaging studies  Micro Results Recent Results (from the past 240 hour(s))  Blood culture (routine x 2)     Status: None (Preliminary result)   Collection Time: 09/11/16  8:42 AM  Result Value Ref Range Status   Specimen Description BLOOD LEFT ANTECUBITAL  Final   Special Requests BOTTLES DRAWN AEROBIC AND ANAEROBIC 6CC  Final   Culture PENDING  Incomplete   Report Status PENDING  Incomplete  Blood culture (routine x 2)     Status: None (Preliminary result)   Collection Time: 09/11/16 10:27 AM  Result Value Ref Range Status   Specimen Description  BLOOD BLOOD LEFT FOREARM  Final   Special Requests BOTTLES DRAWN AEROBIC AND ANAEROBIC 6CC  Final   Culture PENDING  Incomplete   Report Status PENDING  Incomplete  CSF culture     Status: None (Preliminary result)   Collection Time: 09/12/16  9:36 AM  Result Value Ref Range Status   Specimen Description CSF  Final   Special Requests TUBE 2  Final   Gram Stain   Final    WBC PRESENT, PREDOMINANTLY MONONUCLEAR NO ORGANISMS SEEN    Culture PENDING  Incomplete   Report Status PENDING  Incomplete    Radiology Reports Ct Head Wo Contrast  Result Date: 09/11/2016 CLINICAL DATA:  Altered mental status.  Headache.  Confusion. EXAM: CT HEAD WITHOUT CONTRAST TECHNIQUE: Contiguous axial images were obtained from the base of the skull through the vertex without intravenous contrast. COMPARISON:  None. FINDINGS: Brain: No evidence of acute infarction, hemorrhage, hydrocephalus, extra-axial collection or mass lesion/mass effect. Normal cerebral volume. No white matter disease. Vascular: No hyperdense vessel or unexpected calcification. Skull: Normal. Negative for fracture or focal lesion. Sinuses/Orbits: No acute finding. Mild mucosal thickening LEFT division sphenoid. Other: None. IMPRESSION: Negative exam.  Findings discussed with ordering provider. Electronically Signed   By: Elsie Stain M.D.   On: 09/11/2016 09:08   Dg Chest Portable 1 View  Result Date: 09/11/2016 CLINICAL DATA:  Altered mental status. EXAM: PORTABLE CHEST 1 VIEW COMPARISON:  None. FINDINGS: Poor inspiration. Borderline enlarged cardiac silhouette. Prominent pulmonary vasculature and interstitial markings, accentuated by the poor inspiration. Unremarkable bones. IMPRESSION: No acute abnormality. Electronically Signed   By: Beckie Salts M.D.   On: 09/11/2016 09:46   Dg Fluoro Guide Lumbar Puncture  Result Date: 09/12/2016 CLINICAL DATA:  Acute encephalopathy. EXAM: DIAGNOSTIC LUMBAR PUNCTURE UNDER FLUOROSCOPIC GUIDANCE  FLUOROSCOPY TIME:  Fluoroscopy Time:  0 minutes, 15 seconds Radiation Exposure Index (if provided by the fluoroscopic device): N/A Number of Acquired Spot Images: 0 PROCEDURE: I discussed the risks (including hemorrhage, infection, headache, and nerve damage, among others), benefits, and alternatives to fluoroscopically guided lumbar puncture with the patient and with his father. In my opinion, the patient is not entirely of normal mental status despite the reported progression from yesterday. Accordingly, the patient's father was used as the primary person to grant consent, although both the patient and his father elected for the patient to undergo the procedure. We specifically discussed the high technical likelihood of success of the procedure. Standard time-out was employed. Following sterile skin prep and local anesthetic administration consisting of 1 percent lidocaine, a 22 gauge spinal needle was advanced without difficulty into the thecal sac at the at the L2-3 level. Clear CSF was returned. Opening pressure was 28 cm of water, but was not measured with the patient prone rather than laying on his side ; in my judgment, based on the patient's body habitus, turning him up on this side on the fluoroscopy table would have been risky. 12 cc of clear CSF was collected. The needle was subsequently removed and the skin cleansed and bandaged. No immediate complications were observed. IMPRESSION: 1. Technically successful fluoroscopically guided lumbar puncture at the L2-3 level, yielding 12 cc of clear CSF and an opening pressure of 28 cm of water. Electronically Signed   By: Gaylyn Rong M.D.   On: 09/12/2016 09:52     CBC  Recent Labs Lab 09/11/16 0838 09/11/16 0850 09/12/16 0634 09/13/16 0521  WBC 7.9  --  7.0 5.7  HGB 14.7 14.6 12.4* 13.2  HCT 43.0 43.0 37.7* 39.2  PLT 193  --  150 161  MCV 86.3  --  86.7 86.5  MCH 29.5  --  28.5 29.1  MCHC 34.2  --  32.9 33.7  RDW 13.0  --  13.5 13.3   LYMPHSABS 2.1  --   --   --   MONOABS 0.4  --   --   --   EOSABS 0.2  --   --   --   BASOSABS 0.0  --   --   --     Chemistries   Recent Labs Lab 09/11/16 0838 09/11/16 0850 09/12/16 0634 09/13/16 0521  NA 139 140 138 137  K 3.7 3.7 3.6 3.7  CL 105 103 106 103  CO2 24  --  24 24  GLUCOSE 198* 189* 124* 112*  BUN 14 14 10 7   CREATININE 0.94 0.90 0.92 0.87  CALCIUM 9.6  --  9.0 9.3  AST 25  --  19 19  ALT 22  --  18 18  ALKPHOS 48  --  41 42  BILITOT 0.6  --  0.9 0.5   ------------------------------------------------------------------------------------------------------------------ estimated creatinine clearance is 160.6 mL/min (by C-G formula based on SCr of 0.87 mg/dL). ------------------------------------------------------------------------------------------------------------------ No results for input(s): HGBA1C in the last 72 hours. ------------------------------------------------------------------------------------------------------------------ No results for input(s): CHOL, HDL, LDLCALC, TRIG, CHOLHDL, LDLDIRECT in the last 72 hours. ------------------------------------------------------------------------------------------------------------------ No results for input(s): TSH, T4TOTAL, T3FREE, THYROIDAB in the last 72 hours.  Invalid input(s): FREET3 ------------------------------------------------------------------------------------------------------------------ No results for input(s): VITAMINB12, FOLATE, FERRITIN, TIBC, IRON, RETICCTPCT in the last 72 hours.  Coagulation profile No results for input(s): INR, PROTIME in the last 168 hours.  No results for input(s): DDIMER in the last 72 hours.  Cardiac Enzymes No results for input(s): CKMB, TROPONINI, MYOGLOBIN in the last 168 hours.  Invalid input(s): CK ------------------------------------------------------------------------------------------------------------------ Invalid input(s): POCBNP   CBG: No  results for input(s): GLUCAP in the last 168 hours.     Studies: Dg Fluoro Guide Lumbar Puncture  Result Date: 09/12/2016 CLINICAL DATA:  Acute encephalopathy. EXAM: DIAGNOSTIC LUMBAR PUNCTURE UNDER FLUOROSCOPIC GUIDANCE FLUOROSCOPY TIME:  Fluoroscopy Time:  0 minutes, 15 seconds Radiation Exposure Index (if provided by the fluoroscopic device): N/A Number of Acquired Spot Images: 0 PROCEDURE: I discussed the risks (including hemorrhage, infection, headache, and nerve damage, among others), benefits, and alternatives to fluoroscopically guided lumbar puncture with the patient and with his father. In my opinion, the patient is not entirely of normal mental status despite the reported progression from yesterday. Accordingly, the patient's father was used as the primary person to grant consent, although both the patient and his father elected for the patient to undergo the procedure. We specifically discussed the high technical likelihood of success of the procedure. Standard time-out was employed. Following sterile skin prep and local anesthetic administration consisting of 1 percent lidocaine, a 22 gauge spinal needle was advanced without difficulty into the thecal sac at the at the L2-3 level. Clear CSF was returned. Opening pressure was 28 cm of water, but was not measured with the patient prone rather than laying on his side ; in my judgment, based on the patient's body habitus, turning him up on this side on the fluoroscopy table would have been risky. 12 cc of clear CSF was collected. The needle was subsequently removed and the skin cleansed and bandaged. No immediate complications were observed. IMPRESSION: 1. Technically successful fluoroscopically guided lumbar puncture at the L2-3 level, yielding 12 cc of clear CSF and an opening pressure of 28 cm of water. Electronically Signed   By: Gaylyn Rong M.D.   On: 09/12/2016 09:52      No results found for: HGBA1C Lab Results  Component Value  Date   CREATININE 0.87 09/13/2016       Scheduled Meds: . acyclovir  10 mg/kg (Adjusted) Intravenous Q8H  . cefTRIAXone (ROCEPHIN)  IV  2 g Intravenous Q12H  . doxycycline (VIBRAMYCIN) IV  100 mg Intravenous Q12H  . enoxaparin (LOVENOX) injection  40 mg Subcutaneous Q24H   Continuous Infusions: . sodium chloride 75 mL/hr at 09/11/16 1515     LOS: 2 days    Time spent: >30  MINS    Endocentre At Quarterfield StationBROL,Jannetta Massey  [facility] Pager [phone]. If 7PM-7AM, please contact night-coverage at [email or web address], password [email or web address] 09/13/2016, 12:01 PM  LOS: 2 days

## 2016-09-13 NOTE — Evaluation (Signed)
Occupational Therapy Evaluation Patient Details Name: Ethan Logan MRN: [id number] DOB: 07-05-1976 Today's Date: 09/13/2016    History of Present Illness Pt is a 40 yo male admitted with severe headache, sz activity and LOC.  Pt found to have aseptic meningitis.  Pt currently being treated and feeling well.   Clinical Impression   Pt admitted with the above diagnosis and appears to have returned to baseline.  No further OT needs.    Follow Up Recommendations  No OT follow up;Supervision - Intermittent    Equipment Recommendations  None recommended by OT    Recommendations for Other Services       Precautions / Restrictions Precautions Precautions: None Restrictions Weight Bearing Restrictions: No      Mobility Bed Mobility Overal bed mobility: Independent                Transfers Overall transfer level: Independent Equipment used: None             General transfer comment: No assist needed    Balance Overall balance assessment: Independent                                          ADL Overall ADL's : Independent                                       General ADL Comments: Pt back to baseline and independent     Vision     Perception     Praxis      Pertinent Vitals/Pain Pain Assessment: No/denies pain     Hand Dominance Right   Extremity/Trunk Assessment Upper Extremity Assessment Upper Extremity Assessment: Overall WFL for tasks assessed   Lower Extremity Assessment Lower Extremity Assessment: Overall WFL for tasks assessed   Cervical / Trunk Assessment Cervical / Trunk Assessment: Normal   Communication Communication Communication: No difficulties   Cognition Arousal/Alertness: Awake/alert Behavior During Therapy: WFL for tasks assessed/performed Overall Cognitive Status: Within Functional Limits for tasks assessed                     General Comments       Exercises        Shoulder Instructions      Home Living Family/patient expects to be discharged to:: Private residence Living Arrangements: Spouse/significant other Available Help at Discharge: Family;Available 24 hours/day Type of Home: House Home Access: Stairs to enter Entergy CorporationEntrance Stairs-Number of Steps: 5   Home Layout: Two level     Bathroom Shower/Tub: Walk-in shower;Door   Foot LockerBathroom Toilet: Standard     Home Equipment: None          Prior Functioning/Environment Level of Independence: Independent        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        OT Problem List:     OT Treatment/Interventions:      OT Goals(Current goals can be found in the care plan section) Acute Rehab OT Goals Patient Stated Goal: to go home ASAP OT Goal Formulation: All assessment and education complete, DC therapy  OT Frequency:     Barriers to D/C:            Co-evaluation              End of  Session Nurse Communication: Mobility status  Activity Tolerance: Patient tolerated treatment well Patient left: in bed;with family/visitor present;with call bell/phone within reach   Time: 1209-1225 OT Time Calculation (min): 16 min Charges:  OT General Charges $OT Visit: 1 Procedure OT Evaluation $OT Eval Low Complexity: 1 Procedure G-Codes:    Hope BuddsJones, Kyndall Amero Anne 09/13/2016, 12:28 PM  [phone]

## 2016-09-13 NOTE — Progress Notes (Signed)
PT Cancellation Note  Patient Details Name: Ethan BouchardRandy D Murnane MRN: [id number] DOB: 1976/07/28   Cancelled Treatment:    Reason Eval/Treat Not Completed: PT screened, no needs identified, will sign off. Per OT, pt is at baseline functioning. 09/13/2016  Bethel BingKen Lia Vigilante, PT [phone]  (pager)   Eliseo GumKenneth V Dominion Kathan 09/13/2016, 2:35 PM

## 2016-09-13 NOTE — Care Management Note (Addendum)
Case Management Note  Patient Details  Name: Ethan Logan MRN: [id number] Date of Birth: [birth year]  Subjective/Objective:                 Patient admitted with aseptic meningitis. DC plan pending CSF culture. From home with wife.    Action/Plan:  Will continue to follow for DC planning.  09/15/16 DC to home self care  Expected Discharge Date:                  Expected Discharge Plan:  Home/Self Care  In-House Referral:     Discharge planning Services  CM Consult  Post Acute Care Choice:    Choice offered to:     DME Arranged:    DME Agency:     HH Arranged:    HH Agency:     Status of Service:  In process, will continue to follow  If discussed at Long Length of Stay Meetings, dates discussed:    Additional Comments:  Lawerance SabalDebbie Rebekkah Powless, RN 09/13/2016, 1:29 PM

## 2016-09-14 LAB — EHRLICHIA ANTIBODY PANEL
E chaffeensis (HGE) Ab, IgG: NEGATIVE
E chaffeensis (HGE) Ab, IgM: NEGATIVE
E. Chaffeensis (HME) IgM Titer: NEGATIVE
E.Chaffeensis (HME) IgG: NEGATIVE

## 2016-09-14 LAB — HIV-1 RNA, QUALITATIVE, TMA: HIV-1 RNA, Qualitative, TMA: NEGATIVE

## 2016-09-14 LAB — PATHOLOGIST SMEAR REVIEW

## 2016-09-14 NOTE — Progress Notes (Signed)
[facility] PROGRESS NOTE  Ethan BouchardRandy D Logan ZOX:[id number] DOB: 10-28-1975 DOA: 09/11/2016   [facility]     Assessment/Plan: Active Problems:   Acute encephalopathy   Headache   40 y.o. male without PMH. He has been having URI symptoms for the past week. History obtained by parents at bedside as patient is too confused to provide any. They state he has been taking Aleve Cold for nasal congestion. He has taken 20 tablets over the past 4 days. 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    He was found sitting slumped over in the couch, with jerky movements/tremors and unable to respond to questions. EMS brought him to the ED for evaluation  UDS positive for amphetamines (likely psuedoephedrine effect), CXR with NAD, CT Head negative, EKG no acute changes. Patient evaluated by neurohospitalist. [facility]  Assessment and plan Acute infectious Encephalopathy/ /encephalitis/meningitis  HIV nonreactive, cryptococcal antigen negative, influenza PCR negative, blood culture still pending, CSF culture no growth so far HSV PCR negative RMSF IgM negative, IgG positive Continue  Rocephin, doxycycline and acyclovir, vancomycin discontinued Patient has 330 white blood cells in CSF, total protein 118 Infectious disease consult, Many possible causes- viral (flu, HIV, HSV, enterovirus) as well as NSAIDS   Concern for tick-borne diseases. Ehrlichia titers pending EBV, arbovirus,enterovirus pending  UDS positive for amphetamines No indication for MRI or EEG  Final disposition as per infectious disease recommendations   DVT prophylaxsis  Lovenox  Code Status:  Full code   Family Communication: Discussed in detail with the patient, all imaging results, lab results explained to the patient   Disposition Plan:  Disposition per infectious disease     Consultants:  Neurology  Infectious  disease  Procedures:  LP  Antibiotics: Anti-infectives    Start     Dose/Rate Route Frequency Ordered Stop   09/12/16 0030  vancomycin (VANCOCIN) IVPB 1000 mg/200 mL premix  Status:  Discontinued     1,000 mg 200 mL/hr over 60 Minutes Intravenous Every 8 hours 09/11/16 1532 09/12/16 1911   09/11/16 2200  cefTRIAXone (ROCEPHIN) 2 g in dextrose 5 % 50 mL IVPB     2 g 100 mL/hr over 30 Minutes Intravenous Every 12 hours 09/11/16 1458     09/11/16 2200  doxycycline (VIBRAMYCIN) 100 mg in dextrose 5 % 250 mL IVPB     100 mg 125 mL/hr over 120 Minutes Intravenous Every 12 hours 09/11/16 1458     09/11/16 1830  acyclovir (ZOVIRAX) 935 mg in dextrose 5 % 150 mL IVPB     10 mg/kg  93.3 kg (Adjusted) 168.7 mL/hr over 60 Minutes Intravenous Every 8 hours 09/11/16 1532     09/11/16 1545  vancomycin (VANCOCIN) 1,500 mg in sodium chloride 0.9 % 500 mL IVPB     1,500 mg 250 mL/hr over 120 Minutes Intravenous  Once 09/11/16 1532 09/11/16 1853   09/11/16 1200  doxycycline (VIBRAMYCIN) 100 mg in dextrose 5 % 250 mL IVPB     100 mg 125 mL/hr over 120 Minutes Intravenous  Once 09/11/16 1136 09/11/16 1402   09/11/16 1000  acyclovir (ZOVIRAX) 800 mg in dextrose 5 % 150 mL IVPB     10 mg/kg  79.9 kg (Ideal) 166 mL/hr over 60 Minutes Intravenous  Once 09/11/16 0943 09/11/16 1146   09/11/16 0945  cefTRIAXone (ROCEPHIN) 2 g in dextrose 5 % 50 mL IVPB  2 g 100 mL/hr over 30 Minutes Intravenous  Once 09/11/16 0943 09/11/16 1027   09/11/16 0945  vancomycin (VANCOCIN) IVPB 1000 mg/200 mL premix     1,000 mg 200 mL/hr over 60 Minutes Intravenous  Once 09/11/16 0943 09/11/16 1129         HPI/Subjective: Denies any headache, blurry vision, nausea vomiting  Objective: Vitals:   09/13/16 0519 09/13/16 1510 09/13/16 2127 09/14/16 0449  BP: 130/77 119/62 132/82 113/74  Pulse: 62 80 73 (!) 53  Resp: 20 18 19 20   Temp: 97.7 F (36.5 C) 98 F (36.7 C) 98.3 F (36.8 C) 98 F (36.7 C)  TempSrc:  Oral Oral  Oral  SpO2: 96% 97% 97% 96%  Weight:      Height:        Intake/Output Summary (Last 24 hours) at 09/14/16 0806 Last data filed at 09/14/16 0548  Gross per 24 hour  Intake             1468 ml  Output             1575 ml  Net             -107 ml    Exam:  Examination:  General exam: Appears calm and comfortable  Respiratory system: Clear to auscultation. Respiratory effort normal. Cardiovascular system: S1 & S2 heard, RRR. No JVD, murmurs, rubs, gallops or clicks. No pedal edema. Gastrointestinal system: Abdomen is nondistended, soft and nontender. No organomegaly or masses felt. Normal bowel sounds heard. Central nervous system: Alert and oriented. No focal neurological deficits. Extremities: Symmetric 5 x 5 power. Skin: No rashes, lesions or ulcers Psychiatry: Judgement and insight appear normal. Mood & affect appropriate.     Data Reviewed: I have personally reviewed following labs and imaging studies  Micro Results Recent Results (from the past 240 hour(s))  Blood culture (routine x 2)     Status: None (Preliminary result)   Collection Time: 09/11/16  8:42 AM  Result Value Ref Range Status   Specimen Description BLOOD LEFT ANTECUBITAL  Final   Special Requests BOTTLES DRAWN AEROBIC AND ANAEROBIC 6CC  Final   Culture PENDING  Incomplete   Report Status PENDING  Incomplete  Blood culture (routine x 2)     Status: None (Preliminary result)   Collection Time: 09/11/16 10:27 AM  Result Value Ref Range Status   Specimen Description BLOOD BLOOD LEFT FOREARM  Final   Special Requests BOTTLES DRAWN AEROBIC AND ANAEROBIC 6CC  Final   Culture PENDING  Incomplete   Report Status PENDING  Incomplete  CSF culture     Status: None (Preliminary result)   Collection Time: 09/12/16  9:36 AM  Result Value Ref Range Status   Specimen Description CSF  Final   Special Requests TUBE 2  Final   Gram Stain   Final    WBC PRESENT, PREDOMINANTLY MONONUCLEAR NO ORGANISMS  SEEN    Culture NO GROWTH 1 DAY  Final   Report Status PENDING  Incomplete    Radiology Reports Ct Head Wo Contrast  Result Date: 09/11/2016 CLINICAL DATA:  Altered mental status.  Headache.  Confusion. EXAM: CT HEAD WITHOUT CONTRAST TECHNIQUE: Contiguous axial images were obtained from the base of the skull through the vertex without intravenous contrast. COMPARISON:  None. FINDINGS: Brain: No evidence of acute infarction, hemorrhage, hydrocephalus, extra-axial collection or mass lesion/mass effect. Normal cerebral volume. No white matter disease. Vascular: No hyperdense vessel or unexpected calcification. Skull: Normal. Negative for fracture  or focal lesion. Sinuses/Orbits: No acute finding. Mild mucosal thickening LEFT division sphenoid. Other: None. IMPRESSION: Negative exam.  Findings discussed with ordering provider. Electronically Signed   By: Elsie Stain M.D.   On: 09/11/2016 09:08   Dg Chest Portable 1 View  Result Date: 09/11/2016 CLINICAL DATA:  Altered mental status. EXAM: PORTABLE CHEST 1 VIEW COMPARISON:  None. FINDINGS: Poor inspiration. Borderline enlarged cardiac silhouette. Prominent pulmonary vasculature and interstitial markings, accentuated by the poor inspiration. Unremarkable bones. IMPRESSION: No acute abnormality. Electronically Signed   By: Beckie Salts M.D.   On: 09/11/2016 09:46   Dg Fluoro Guide Lumbar Puncture  Result Date: 09/12/2016 CLINICAL DATA:  Acute encephalopathy. EXAM: DIAGNOSTIC LUMBAR PUNCTURE UNDER FLUOROSCOPIC GUIDANCE FLUOROSCOPY TIME:  Fluoroscopy Time:  0 minutes, 15 seconds Radiation Exposure Index (if provided by the fluoroscopic device): N/A Number of Acquired Spot Images: 0 PROCEDURE: I discussed the risks (including hemorrhage, infection, headache, and nerve damage, among others), benefits, and alternatives to fluoroscopically guided lumbar puncture with the patient and with his father. In my opinion, the patient is not entirely of normal mental  status despite the reported progression from yesterday. Accordingly, the patient's father was used as the primary person to grant consent, although both the patient and his father elected for the patient to undergo the procedure. We specifically discussed the high technical likelihood of success of the procedure. Standard time-out was employed. Following sterile skin prep and local anesthetic administration consisting of 1 percent lidocaine, a 22 gauge spinal needle was advanced without difficulty into the thecal sac at the at the L2-3 level. Clear CSF was returned. Opening pressure was 28 cm of water, but was not measured with the patient prone rather than laying on his side ; in my judgment, based on the patient's body habitus, turning him up on this side on the fluoroscopy table would have been risky. 12 cc of clear CSF was collected. The needle was subsequently removed and the skin cleansed and bandaged. No immediate complications were observed. IMPRESSION: 1. Technically successful fluoroscopically guided lumbar puncture at the L2-3 level, yielding 12 cc of clear CSF and an opening pressure of 28 cm of water. Electronically Signed   By: Gaylyn Rong M.D.   On: 09/12/2016 09:52     CBC  Recent Labs Lab 09/11/16 0838 09/11/16 0850 09/12/16 0634 09/13/16 0521  WBC 7.9  --  7.0 5.7  HGB 14.7 14.6 12.4* 13.2  HCT 43.0 43.0 37.7* 39.2  PLT 193  --  150 161  MCV 86.3  --  86.7 86.5  MCH 29.5  --  28.5 29.1  MCHC 34.2  --  32.9 33.7  RDW 13.0  --  13.5 13.3  LYMPHSABS 2.1  --   --   --   MONOABS 0.4  --   --   --   EOSABS 0.2  --   --   --   BASOSABS 0.0  --   --   --     Chemistries   Recent Labs Lab 09/11/16 0838 09/11/16 0850 09/12/16 0634 09/13/16 0521  NA 139 140 138 137  K 3.7 3.7 3.6 3.7  CL 105 103 106 103  CO2 24  --  24 24  GLUCOSE 198* 189* 124* 112*  BUN 14 14 10 7   CREATININE 0.94 0.90 0.92 0.87  CALCIUM 9.6  --  9.0 9.3  AST 25  --  19 19  ALT 22  --  18 18   ALKPHOS  48  --  41 42  BILITOT 0.6  --  0.9 0.5   ------------------------------------------------------------------------------------------------------------------ estimated creatinine clearance is 160.6 mL/min (by C-G formula based on SCr of 0.87 mg/dL). ------------------------------------------------------------------------------------------------------------------ No results for input(s): HGBA1C in the last 72 hours. ------------------------------------------------------------------------------------------------------------------ No results for input(s): CHOL, HDL, LDLCALC, TRIG, CHOLHDL, LDLDIRECT in the last 72 hours. ------------------------------------------------------------------------------------------------------------------ No results for input(s): TSH, T4TOTAL, T3FREE, THYROIDAB in the last 72 hours.  Invalid input(s): FREET3 ------------------------------------------------------------------------------------------------------------------ No results for input(s): VITAMINB12, FOLATE, FERRITIN, TIBC, IRON, RETICCTPCT in the last 72 hours.  Coagulation profile No results for input(s): INR, PROTIME in the last 168 hours.  No results for input(s): DDIMER in the last 72 hours.  Cardiac Enzymes No results for input(s): CKMB, TROPONINI, MYOGLOBIN in the last 168 hours.  Invalid input(s): CK ------------------------------------------------------------------------------------------------------------------ Invalid input(s): POCBNP   CBG: No results for input(s): GLUCAP in the last 168 hours.     Studies: Dg Fluoro Guide Lumbar Puncture  Result Date: 09/12/2016 CLINICAL DATA:  Acute encephalopathy. EXAM: DIAGNOSTIC LUMBAR PUNCTURE UNDER FLUOROSCOPIC GUIDANCE FLUOROSCOPY TIME:  Fluoroscopy Time:  0 minutes, 15 seconds Radiation Exposure Index (if provided by the fluoroscopic device): N/A Number of Acquired Spot Images: 0 PROCEDURE: I discussed the risks (including  hemorrhage, infection, headache, and nerve damage, among others), benefits, and alternatives to fluoroscopically guided lumbar puncture with the patient and with his father. In my opinion, the patient is not entirely of normal mental status despite the reported progression from yesterday. Accordingly, the patient's father was used as the primary person to grant consent, although both the patient and his father elected for the patient to undergo the procedure. We specifically discussed the high technical likelihood of success of the procedure. Standard time-out was employed. Following sterile skin prep and local anesthetic administration consisting of 1 percent lidocaine, a 22 gauge spinal needle was advanced without difficulty into the thecal sac at the at the L2-3 level. Clear CSF was returned. Opening pressure was 28 cm of water, but was not measured with the patient prone rather than laying on his side ; in my judgment, based on the patient's body habitus, turning him up on this side on the fluoroscopy table would have been risky. 12 cc of clear CSF was collected. The needle was subsequently removed and the skin cleansed and bandaged. No immediate complications were observed. IMPRESSION: 1. Technically successful fluoroscopically guided lumbar puncture at the L2-3 level, yielding 12 cc of clear CSF and an opening pressure of 28 cm of water. Electronically Signed   By: Gaylyn RongWalter  Liebkemann M.D.   On: 09/12/2016 09:52      No results found for: HGBA1C Lab Results  Component Value Date   CREATININE 0.87 09/13/2016       Scheduled Meds: . acyclovir  10 mg/kg (Adjusted) Intravenous Q8H  . cefTRIAXone (ROCEPHIN)  IV  2 g Intravenous Q12H  . doxycycline (VIBRAMYCIN) IV  100 mg Intravenous Q12H  . enoxaparin (LOVENOX) injection  40 mg Subcutaneous Q24H   Continuous Infusions: . sodium chloride 75 mL/hr at 09/14/16 0548     LOS: 3 days    Time spent: >30 MINS    [facility],Joshawa Dubin  [facility] Pager [phone]. If 7PM-7AM, please contact night-coverage at [email or web address], password [email or web address] 09/14/2016, 8:06 AM  LOS: 3 days

## 2016-09-15 LAB — EPSTEIN BARR VRS(EBV DNA BY PCR)
EBV DNA QN by PCR: NEGATIVE copies/mL
LOG10 EBV DNA QN PCR: UNDETERMINED {Log_copies}/mL

## 2016-09-15 LAB — LYMPHOCYTE SUBSETS, FLOW CYTOMETRY (INPT)

## 2016-09-15 LAB — CSF CULTURE W GRAM STAIN: Culture: NO GROWTH

## 2016-09-15 LAB — CSF CULTURE

## 2016-09-15 MED ORDER — ONDANSETRON HCL 4 MG PO TABS: 4.0000 mg | ORAL_TABLET | Freq: Four times a day (QID) | ORAL | 0 refills | Status: DC | PRN

## 2016-09-15 NOTE — Progress Notes (Signed)
INFECTIOUS DISEASE PROGRESS NOTE  ID: Ethan Logan is a 40 y.o. male with  Active Problems:   Acute encephalopathy   Headache  Subjective: occas headache with rising from sitting.   Abtx:  Anti-infectives    Start     Dose/Rate Route Frequency Ordered Stop   09/12/16 0030  vancomycin (VANCOCIN) IVPB 1000 mg/200 mL premix  Status:  Discontinued     1,000 mg 200 mL/hr over 60 Minutes Intravenous Every 8 hours 09/11/16 1532 09/12/16 1911   09/11/16 2200  cefTRIAXone (ROCEPHIN) 2 g in dextrose 5 % 50 mL IVPB     2 g 100 mL/hr over 30 Minutes Intravenous Every 12 hours 09/11/16 1458     09/11/16 2200  doxycycline (VIBRAMYCIN) 100 mg in dextrose 5 % 250 mL IVPB     100 mg 125 mL/hr over 120 Minutes Intravenous Every 12 hours 09/11/16 1458     09/11/16 1830  acyclovir (ZOVIRAX) 935 mg in dextrose 5 % 150 mL IVPB     10 mg/kg  93.3 kg (Adjusted) 168.7 mL/hr over 60 Minutes Intravenous Every 8 hours 09/11/16 1532     09/11/16 1545  vancomycin (VANCOCIN) 1,500 mg in sodium chloride 0.9 % 500 mL IVPB     1,500 mg 250 mL/hr over 120 Minutes Intravenous  Once 09/11/16 1532 09/11/16 1853   09/11/16 1200  doxycycline (VIBRAMYCIN) 100 mg in dextrose 5 % 250 mL IVPB     100 mg 125 mL/hr over 120 Minutes Intravenous  Once 09/11/16 1136 09/11/16 1402   09/11/16 1000  acyclovir (ZOVIRAX) 800 mg in dextrose 5 % 150 mL IVPB     10 mg/kg  79.9 kg (Ideal) 166 mL/hr over 60 Minutes Intravenous  Once 09/11/16 0943 09/11/16 1146   09/11/16 0945  cefTRIAXone (ROCEPHIN) 2 g in dextrose 5 % 50 mL IVPB     2 g 100 mL/hr over 30 Minutes Intravenous  Once 09/11/16 0943 09/11/16 1027   09/11/16 0945  vancomycin (VANCOCIN) IVPB 1000 mg/200 mL premix     1,000 mg 200 mL/hr over 60 Minutes Intravenous  Once 09/11/16 0943 09/11/16 1129      Medications:  Scheduled: . acyclovir  10 mg/kg (Adjusted) Intravenous Q8H  . cefTRIAXone (ROCEPHIN)  IV  2 g Intravenous Q12H  . doxycycline (VIBRAMYCIN) IV   100 mg Intravenous Q12H  . enoxaparin (LOVENOX) injection  40 mg Subcutaneous Q24H    Objective: Vital signs in last 24 hours: Temp:  [97.7 F (36.5 C)-98.1 F (36.7 C)] 98 F (36.7 C) (12/07 0628) Pulse Rate:  [58-79] 58 (12/07 0628) Resp:  [20] 20 (12/07 0213) BP: (124-133)/(72-83) 130/72 (12/07 0628) SpO2:  [98 %-100 %] 98 % (12/07 0628)   General appearance: alert, cooperative and no distress Resp: clear to auscultation bilaterally Cardio: regular rate and rhythm GI: normal findings: bowel sounds normal and soft, non-tender  Up out of bed, non-focal.   Lab Results  Recent Labs  09/13/16 0521  WBC 5.7  HGB 13.2  HCT 39.2  NA 137  K 3.7  CL 103  CO2 24  BUN 7  CREATININE 0.87   Liver Panel  Recent Labs  09/13/16 0521  PROT 6.6  ALBUMIN 3.8  AST 19  ALT 18  ALKPHOS 42  BILITOT 0.5   Sedimentation Rate No results for input(s): ESRSEDRATE in the last 72 hours. C-Reactive Protein No results for input(s): CRP in the last 72 hours.  Microbiology: Recent Results (from the past 240  hour(s))  Blood culture (routine x 2)     Status: None (Preliminary result)   Collection Time: 09/11/16  8:42 AM  Result Value Ref Range Status   Specimen Description BLOOD LEFT ANTECUBITAL  Final   Special Requests BOTTLES DRAWN AEROBIC AND ANAEROBIC 6CC  Final   Culture NO GROWTH 3 DAYS  Final   Report Status PENDING  Incomplete  Blood culture (routine x 2)     Status: None (Preliminary result)   Collection Time: 09/11/16 10:27 AM  Result Value Ref Range Status   Specimen Description BLOOD BLOOD LEFT FOREARM  Final   Special Requests BOTTLES DRAWN AEROBIC AND ANAEROBIC 6CC  Final   Culture NO GROWTH 3 DAYS  Final   Report Status PENDING  Incomplete  CSF culture     Status: None   Collection Time: 09/12/16  9:36 AM  Result Value Ref Range Status   Specimen Description CSF  Final   Special Requests TUBE 2  Final   Gram Stain   Final    WBC PRESENT, PREDOMINANTLY  MONONUCLEAR NO ORGANISMS SEEN    Culture NO GROWTH 3 DAYS  Final   Report Status 09/15/2016 FINAL  Final    Studies/Results: No results found.   Assessment/Plan: Aseptic meiningitis  HIV RNA (-) RMSF/Ehrlichia (-) HSV PCR (-) Cx (-)  Can be d/c off anbx and off antivirals.  Can f/u in ID clinic in 2 weeks for results of arbovirus panel.   Total days of antibiotics: 4 doxy/ceftriaxone/acyclovir         Ethan Logan Infectious Diseases (pager) [phone] [email or web address] 09/15/2016, 11:56 AM  LOS: 4 days

## 2016-09-15 NOTE — Progress Notes (Signed)
Patient complained of left sided numbness and temporal headache that lasted 10 mins.  Vital signs stable.  Neurological check negative. No abnormalities . Patient stated he no longer feels numb or have a headache  Notified on call K. Schorr.  Will continue to monitor and notify as needed

## 2016-09-15 NOTE — Discharge Summary (Signed)
Physician Discharge Summary  Kirtland BouchardRandy D Fischbach MRN: [id number] DOB/AGE: [birth year] 40 y.o.  [facility]   Admit date: 09/11/2016 Discharge date: 09/15/2016  Discharge Diagnoses:    Active Problems:   Acute encephalopathy   Headache    Follow-up recommendations Follow-up with PCP in 3-5 days , including all  additional recommended appointments as below Follow-up CBC, CMP in 3-5 days  f/u in ID clinic in 2 weeks for results of arbovirus panel.        Current Discharge Medication List    START taking these medications   Details  ondansetron (ZOFRAN) 4 MG tablet Take 1 tablet (4 mg total) by mouth every 6 (six) hours as needed for nausea. Qty: 20 tablet, Refills: 0         Discharge Condition: stable  Discharge Instructions Get Medicines reviewed and adjusted: Please take all your medications with you for your next visit with your Primary MD  Please request your Primary MD to go over all hospital tests and procedure/radiological results at the follow up, please ask your Primary MD to get all Hospital records sent to his/her office.  If you experience worsening of your admission symptoms, develop shortness of breath, life threatening emergency, suicidal or homicidal thoughts you must seek medical attention immediately by calling 911 or calling your MD immediately if symptoms less severe.  You must read complete instructions/literature along with all the possible adverse reactions/side effects for all the Medicines you take and that have been prescribed to you. Take any new Medicines after you have completely understood and accpet all the possible adverse reactions/side effects.   Do not drive when taking Pain medications.   Do not take more than prescribed Pain, Sleep and Anxiety Medications  Special Instructions: If you have smoked or chewed Tobacco in the last 2 yrs please stop smoking, stop any regular Alcohol and or any Recreational drug  use.  Wear Seat belts while driving.  Please note  You were cared for by a hospitalist during your hospital stay. Once you are discharged, your primary care physician will handle any further medical issues. Please note that NO REFILLS for any discharge medications will be authorized once you are discharged, as it is imperative that you return to your primary care physician (or establish a relationship with a primary care physician if you do not have one) for your aftercare needs so that they can reassess your need for medications and monitor your lab values.  Discharge Instructions    Diet - low sodium heart healthy    Complete by:  As directed    Increase activity slowly    Complete by:  As directed        Allergies  Allergen Reactions  . Aspirin Swelling  . Penicillins Rash      Disposition:    Consults:  ID neurology    Significant Diagnostic Studies:  Ct Head Wo Contrast  Result Date: 09/11/2016 CLINICAL DATA:  Altered mental status.  Headache.  Confusion. EXAM: CT HEAD WITHOUT CONTRAST TECHNIQUE: Contiguous axial images were obtained from the base of the skull through the vertex without intravenous contrast. COMPARISON:  None. FINDINGS: Brain: No evidence of acute infarction, hemorrhage, hydrocephalus, extra-axial collection or mass lesion/mass effect. Normal cerebral volume. No white matter disease. Vascular: No hyperdense vessel or unexpected calcification. Skull: Normal. Negative for fracture or focal lesion. Sinuses/Orbits: No acute finding. Mild mucosal thickening LEFT division sphenoid. Other: None. IMPRESSION: Negative exam.  Findings discussed with ordering provider.  Electronically Signed   By: Elsie Stain M.D.   On: 09/11/2016 09:08   Dg Chest Portable 1 View  Result Date: 09/11/2016 CLINICAL DATA:  Altered mental status. EXAM: PORTABLE CHEST 1 VIEW COMPARISON:  None. FINDINGS: Poor inspiration. Borderline enlarged cardiac silhouette. Prominent pulmonary  vasculature and interstitial markings, accentuated by the poor inspiration. Unremarkable bones. IMPRESSION: No acute abnormality. Electronically Signed   By: Beckie Salts M.D.   On: 09/11/2016 09:46   Dg Fluoro Guide Lumbar Puncture  Result Date: 09/12/2016 CLINICAL DATA:  Acute encephalopathy. EXAM: DIAGNOSTIC LUMBAR PUNCTURE UNDER FLUOROSCOPIC GUIDANCE FLUOROSCOPY TIME:  Fluoroscopy Time:  0 minutes, 15 seconds Radiation Exposure Index (if provided by the fluoroscopic device): N/A Number of Acquired Spot Images: 0 PROCEDURE: I discussed the risks (including hemorrhage, infection, headache, and nerve damage, among others), benefits, and alternatives to fluoroscopically guided lumbar puncture with the patient and with his father. In my opinion, the patient is not entirely of normal mental status despite the reported progression from yesterday. Accordingly, the patient's father was used as the primary person to grant consent, although both the patient and his father elected for the patient to undergo the procedure. We specifically discussed the high technical likelihood of success of the procedure. Standard time-out was employed. Following sterile skin prep and local anesthetic administration consisting of 1 percent lidocaine, a 22 gauge spinal needle was advanced without difficulty into the thecal sac at the at the L2-3 level. Clear CSF was returned. Opening pressure was 28 cm of water, but was not measured with the patient prone rather than laying on his side ; in my judgment, based on the patient's body habitus, turning him up on this side on the fluoroscopy table would have been risky. 12 cc of clear CSF was collected. The needle was subsequently removed and the skin cleansed and bandaged. No immediate complications were observed. IMPRESSION: 1. Technically successful fluoroscopically guided lumbar puncture at the L2-3 level, yielding 12 cc of clear CSF and an opening pressure of 28 cm of water.  Electronically Signed   By: Gaylyn Rong M.D.   On: 09/12/2016 09:52        Filed Weights   09/11/16 0821 09/11/16 1723  Weight: 113.4 kg (250 lb) 134.1 kg (295 lb 9.6 oz)     Microbiology: Recent Results (from the past 240 hour(s))  Blood culture (routine x 2)     Status: None (Preliminary result)   Collection Time: 09/11/16  8:42 AM  Result Value Ref Range Status   Specimen Description BLOOD LEFT ANTECUBITAL  Final   Special Requests BOTTLES DRAWN AEROBIC AND ANAEROBIC 6CC  Final   Culture NO GROWTH 3 DAYS  Final   Report Status PENDING  Incomplete  Blood culture (routine x 2)     Status: None (Preliminary result)   Collection Time: 09/11/16 10:27 AM  Result Value Ref Range Status   Specimen Description BLOOD BLOOD LEFT FOREARM  Final   Special Requests BOTTLES DRAWN AEROBIC AND ANAEROBIC 6CC  Final   Culture NO GROWTH 3 DAYS  Final   Report Status PENDING  Incomplete  CSF culture     Status: None   Collection Time: 09/12/16  9:36 AM  Result Value Ref Range Status   Specimen Description CSF  Final   Special Requests TUBE 2  Final   Gram Stain   Final    WBC PRESENT, PREDOMINANTLY MONONUCLEAR NO ORGANISMS SEEN    Culture NO GROWTH 3 DAYS  Final  Report Status 09/15/2016 FINAL  Final       Blood Culture    Component Value Date/Time   SDES CSF 09/12/2016 0936   SPECREQUEST TUBE 2 09/12/2016 0936   CULT NO GROWTH 3 DAYS 09/12/2016 0936   REPTSTATUS 09/15/2016 FINAL 09/12/2016 0936      Labs: No results found for this or any previous visit (from the past 48 hour(s)).   Lipid Panel  No results found for: CHOL, TRIG, HDL, CHOLHDL, VLDL, LDLCALC, LDLDIRECT      HPI :   40 y.o.malewithout PMH. He has been having URI symptoms for the past week. History obtained by parents at bedside as patient was too confused to provide any.He was    taking Aleve Cold for nasal congestion.    He was found sitting slumped over in the couch, with jerky  movements/tremors and unable to respond to questions. EMS brought him to the ED for evaluation  UDS positive for amphetamines (likely psuedoephedrine effect), CXR with NAD, CT Head negative, EKG no acute changes. Patient evaluated by neurohospitalist. patient [facility] for further evaluation.He was started on vancomycin, acyclovir, and doxycycline He underwent LP this AM showing 330 WBC (97% L), Prot 118, Glc 70. Crypto Ag (-).   HOSPITAL COURSE:    Acute infectious Encephalopathy/ /encephalitis/meningitis  HIV nonreactive, cryptococcal antigen negative, influenza PCR negative, blood culture still pending, CSF culture no growth so far HSV PCR negative RMSF IgM negative, IgG positive indicating previous exposure  Total days of antibiotics: 4 doxy/ceftriaxone/acyclovir, vancomycin discontinued 12/5 Patient has 330 white blood cells in CSF, total protein 118 Infectious disease consulted, Many possible causes- viral (flu, HIV, HSV, enterovirus) as well as NSAIDS   Concern for tick-borne diseases. Ehrlichia titers pending EBV, arbovirus,enterovirus pending  UDS positive for amphetamines No indication for MRI or EEG  Can be d/c off anbx and off antivirals per ID .  Can f/u in ID clinic in 2 weeks for results of arbovirus panel.    Discharge Exam:   Blood pressure 130/72, pulse (!) 58, temperature 98 F (36.7 C), temperature source Oral, resp. rate 20, height 6\' 1"  (1.854 m), weight 134.1 kg (295 lb 9.6 oz), SpO2 98 %.  General exam: Appears calm and comfortable  Respiratory system: Clear to auscultation. Respiratory effort normal. Cardiovascular system: S1 & S2 heard, RRR. No JVD, murmurs, rubs, gallops or clicks. No pedal edema. Gastrointestinal system: Abdomen is nondistended, soft and nontender. No organomegaly or masses felt. Normal bowel sounds heard. Central nervous system: Alert and oriented. No focal neurological deficits. Extremities: Symmetric 5 x 5 power. Skin: No  rashes, lesions or ulcers Psychiatry: Judgement and insight appear normal. Mood & affect appropriate.     Follow-up Information    [facility] Follow up.   Why:  To establish a primary care physician.  Contact information: [phone] Or you may call the number on your insurance card, or go online to find a PCP that is accepting new patients with your insurance.        [facility]. Schedule an appointment as soon as possible for a visit in 2 day(s).   Specialty:  General Practice Why:  hospital follow up Contact information: [address] [place] [address] [phone]        Johny Sax, MD. Call.   Specialty:  Infectious Diseases Why:  follow up in 2 weeks  Contact information: [address] [place] [address] [phone]  SignedRicharda Overlie: Adalyn Pennock 09/15/2016, 1:29 PM        Time spent >45 mins

## 2016-09-16 LAB — CULTURE, BLOOD (ROUTINE X 2)
CULTURE: NO GROWTH
Culture: NO GROWTH

## 2016-09-16 LAB — ENTEROVIRUS PCR: Enterovirus PCR: NEGATIVE

## 2016-09-28 ENCOUNTER — Telehealth: Payer: Self-pay

## 2016-09-28 NOTE — Telephone Encounter (Signed)
Patients wife is calling the scheduler for hospital follow ups for advise on ID visit.   Ethan Logan was seen in hospital for ID consult and advised to follow up with his primary care physician.  His wife is calling saying he is having panic attacks, paranoid and not feeling normal. He is very concerned since before his admission he starting loosing his memory.  She has called his primary care physician but they are out on vacation.  She states MRI was not needed during his stay in the hospital and she wonders if now they should have the scan.   She was advised to call the primary care physician for office visit with a partner of his primary for an assessment.  If they feel an ID consult or visit is needed they will call our office or speak with ID physician on call.  I advised she call for a visit today.

## 2016-09-30 LAB — ARBOVIRUS PANEL, ~~LOC~~ LAB

## 2017-09-02 ENCOUNTER — Encounter (HOSPITAL_COMMUNITY): Payer: Self-pay | Admitting: Emergency Medicine

## 2017-09-02 ENCOUNTER — Ambulatory Visit (HOSPITAL_COMMUNITY)
Admission: EM | Admit: 2017-09-02 | Discharge: 2017-09-02 | Disposition: A | Discharge status: Home / Self Care | Payer: BLUE CROSS/BLUE SHIELD | Attending: Internal Medicine | Admitting: Internal Medicine

## 2017-09-02 ENCOUNTER — Other Ambulatory Visit: Payer: Self-pay

## 2017-09-02 DIAGNOSIS — J069 Acute upper respiratory infection, unspecified: Secondary | ICD-10-CM

## 2017-09-02 DIAGNOSIS — B9789 Other viral agents as the cause of diseases classified elsewhere: Secondary | ICD-10-CM

## 2017-09-02 HISTORY — DX: Type 2 diabetes mellitus without complications: E11.9

## 2017-09-02 HISTORY — DX: Essential (primary) hypertension: I10

## 2017-09-02 MED ORDER — HYDROCOD POLST-CPM POLST ER 10-8 MG/5ML PO SUER: 5.0000 mL | Freq: Every evening | ORAL | 0 refills | Status: DC | PRN

## 2017-09-02 MED ORDER — CETIRIZINE-PSEUDOEPHEDRINE ER 5-120 MG PO TB12: 1.0000 | ORAL_TABLET | Freq: Every day | ORAL | 0 refills | Status: DC

## 2017-09-02 MED ORDER — DOXYCYCLINE HYCLATE 100 MG PO CAPS: 100.0000 mg | ORAL_CAPSULE | Freq: Two times a day (BID) | ORAL | 0 refills | Status: AC

## 2017-09-02 MED ORDER — FLUTICASONE PROPIONATE 50 MCG/ACT NA SUSP: 2.0000 | Freq: Every day | NASAL | 0 refills | Status: DC

## 2017-09-02 MED ORDER — BENZONATATE 100 MG PO CAPS: 100.0000 mg | ORAL_CAPSULE | Freq: Three times a day (TID) | ORAL | 0 refills | Status: DC

## 2017-09-02 NOTE — ED Provider Notes (Signed)
[facility]    CSN: [id number] Arrival date & time: 09/02/17  1759     History   Chief Complaint Chief Complaint  Patient presents with  . Cough  . Nasal Congestion    HPI Ethan Logan is a 41 y.o. male.   [age] year old male comes in with history of asthma, HTN, DM, comes in for 5 day history of productive cough, congestion, nasal drainage. Denies fever, chills, night sweats. Mild sore throat, denies ear pain. States shortness of breath and wheezing, especially with coughing. Uses albuterol with good relief, last used a few days ago. States he wakes up at night coughing. Positive sick contact. Current user of smokeless tobacco. States recently diagnosed with DM and is on trial of diet changes, working on better glucose control.       Past Medical History:  Diagnosis Date  . Diabetes mellitus without complication (HCC)   . Hypertension   . Pneumonia 1980    Patient Active Problem List   Diagnosis Date Noted  . Acute encephalopathy 09/11/2016  . Headache 09/11/2016    Past Surgical History:  Procedure Laterality Date  . CYST EXCISION Right ~ 2000   "big one on my lower leg"       Home Medications    Prior to Admission medications   Medication Sig Start Date End Date Taking? Authorizing Provider  HYDROCHLOROTHIAZIDE PO Take by mouth.   Yes [provider]  METFORMIN HCL PO Take by mouth.   Yes [provider]  benzonatate (TESSALON) 100 MG capsule Take 1 capsule (100 mg total) by mouth every 8 (eight) hours. 09/02/17   Cathie HoopsYu, Amy V, PA-C  cetirizine-pseudoephedrine (ZYRTEC-D) 5-120 MG tablet Take 1 tablet by mouth daily. 09/02/17   Cathie HoopsYu, Amy V, PA-C  chlorpheniramine-HYDROcodone (TUSSIONEX PENNKINETIC ER) 10-8 MG/5ML SUER Take 5 mLs by mouth at bedtime as needed for cough. 09/02/17   Cathie HoopsYu, Amy V, PA-C  doxycycline (VIBRAMYCIN) 100 MG capsule Take 1 capsule (100 mg total) by mouth 2 (two) times daily for 7 days. Please fill on 11/29 if  symptoms not improving 09/07/17 09/14/17  Cathie HoopsYu, Amy V, PA-C  fluticasone (FLONASE) 50 MCG/ACT nasal spray Place 2 sprays into both nostrils daily. 09/02/17   Belinda FisherYu, Amy V, PA-C    Family History No family history on file.  Social History Social History   Tobacco Use  . Smoking status: Never Smoker  . Smokeless tobacco: Current User    Types: Snuff  Substance Use Topics  . Alcohol use: Yes    Comment: 09/12/2016 "might drink once/year; might not"  . Drug use: No     Allergies   Aspirin and Penicillins   Review of Systems Review of Systems  Reason unable to perform ROS: See HPI as above.     Physical Exam Triage Vital Signs ED Triage Vitals  Enc Vitals Group     BP 09/02/17 1843 120/72     Pulse Rate 09/02/17 1841 86     Resp 09/02/17 1841 18     Temp 09/02/17 1841 98.4 F (36.9 C)     Temp Source 09/02/17 1841 Oral     SpO2 09/02/17 1841 100 %     Weight --      Height --      Head Circumference --      Peak Flow --      Pain Score --      Pain Loc --      Pain  Edu? --      Excl. in GC? --    No data found.  Updated Vital Signs BP 120/72   Pulse 86   Temp 98.4 F (36.9 C) (Oral)   Resp 18   SpO2 100%   Physical Exam  Constitutional: He is oriented to person, place, and time. He appears well-developed and well-nourished. No distress.  HENT:  Head: Normocephalic and atraumatic.  Right Ear: External ear and ear canal normal. Tympanic membrane is erythematous. Tympanic membrane is not bulging.  Left Ear: External ear and ear canal normal. Tympanic membrane is erythematous. Tympanic membrane is not bulging.  Nose: Mucosal edema and rhinorrhea present. Right sinus exhibits no maxillary sinus tenderness and no frontal sinus tenderness. Left sinus exhibits no maxillary sinus tenderness and no frontal sinus tenderness.  Mouth/Throat: Uvula is midline, oropharynx is clear and moist and mucous membranes are normal.  Eyes: Conjunctivae are normal. Pupils are equal,  round, and reactive to light.  Neck: Normal range of motion. Neck supple.  Cardiovascular: Normal rate, regular rhythm and normal heart sounds. Exam reveals no gallop and no friction rub.  No murmur heard. Pulmonary/Chest: Effort normal and breath sounds normal. He has no decreased breath sounds. He has no wheezes. He has no rhonchi. He has no rales.  Lymphadenopathy:    He has no cervical adenopathy.  Neurological: He is alert and oriented to person, place, and time.  Skin: Skin is warm and dry.  Psychiatric: He has a normal mood and affect. His behavior is normal. Judgment normal.     UC Treatments / Results  Labs (all labs ordered are listed, but only abnormal results are displayed) Labs Reviewed - No data to display  EKG  EKG Interpretation None       Radiology No results found.  Procedures Procedures (including critical care time)  Medications Ordered in UC Medications - No data to display   Initial Impression / Assessment and Plan / UC Course  I have reviewed the triage vital signs and the nursing notes.  Pertinent labs & imaging results that were available during my care of the patient were reviewed by me and considered in my medical decision making (see chart for details).    Discussed with patient history and exam most consistent with viral URI. Symptomatic treatment as needed. Can fill doxycycline if sinus symptoms has not improved in the next 5 days. Push fluids. Return precautions given.    Final Clinical Impressions(s) / UC Diagnoses   Final diagnoses:  Viral URI with cough    ED Discharge Orders        Ordered    doxycycline (VIBRAMYCIN) 100 MG capsule  2 times daily     09/02/17 2006    benzonatate (TESSALON) 100 MG capsule  Every 8 hours     09/02/17 2006    fluticasone (FLONASE) 50 MCG/ACT nasal spray  Daily     09/02/17 2006    cetirizine-pseudoephedrine (ZYRTEC-D) 5-120 MG tablet  Daily     09/02/17 2006    chlorpheniramine-HYDROcodone  (TUSSIONEX PENNKINETIC ER) 10-8 MG/5ML SUER  At bedtime PRN     09/02/17 2006       Belinda FisherYu, Amy V, PA-C 09/02/17 2012

## 2017-09-02 NOTE — Discharge Instructions (Signed)
Tessalon for cough. Tussionex at night for cough. Albuterol as needed for shortness of breath and wheezing. Start flonase, zyrtec-D for nasal congestion. You can use over the counter nasal saline rinse such as neti pot for nasal congestion. Keep hydrated, your urine should be clear to pale yellow in color. Tylenol/motrin for fever and pain. If symptoms not improving in 5 days, can fill doxycycline to take. Monitor for any worsening of symptoms, chest pain, shortness of breath, wheezing, swelling of the throat, follow up for reevaluation.

## 2017-09-02 NOTE — ED Triage Notes (Signed)
Pt c/o congestion, cough, chest and nasal congestion.

## 2018-05-22 IMAGING — CT CT HEAD W/O CM
3 of 4 series · 16 of 47 positions shown, 19 images · non-contrast
Comparison: None.

CLINICAL DATA: Altered mental status.  Headache.  Confusion.

EXAM:
CT HEAD WITHOUT CONTRAST
TECHNIQUE: Contiguous axial images were obtained from the base of the skull
through the vertex without intravenous contrast.

[Series 4: coronal soft tissue · coronal · 0.37mm/px · 3 of 77 slices shown]
[im 26/77  brain]
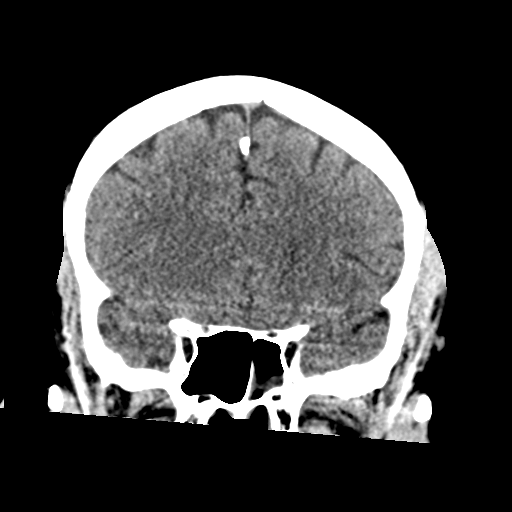
[im 34/77  brain]
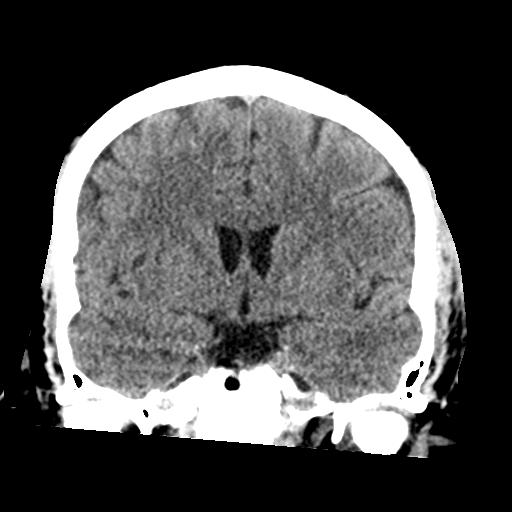
[im 43/77  brain]
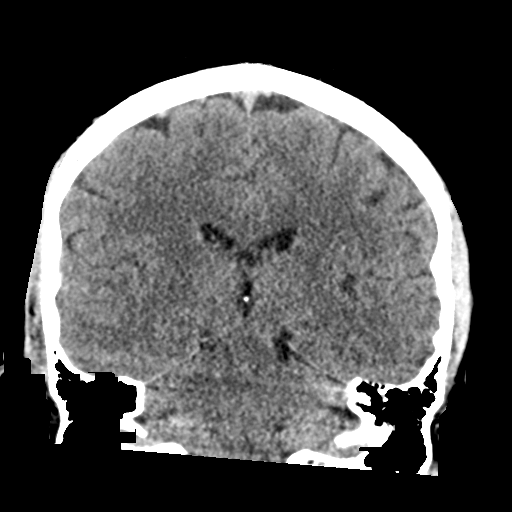

[Series 5: sagittal soft tissue · sagittal · 0.34mm/px · 3 of 67 slices shown]
[im 26/67  brain]
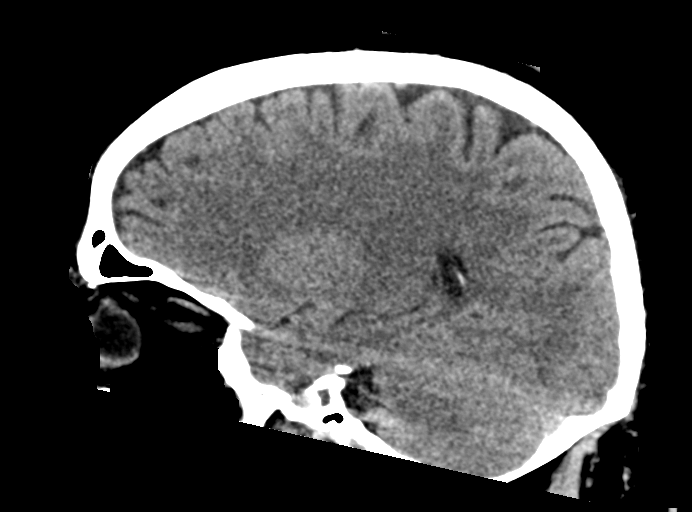
[im 34/67  brain]
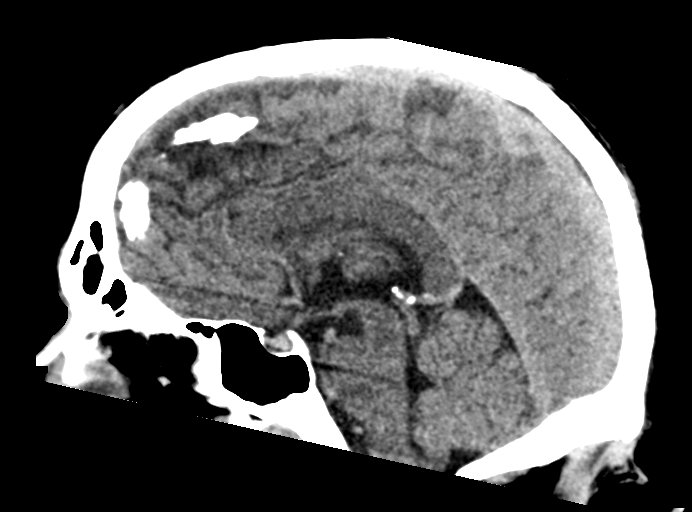
[im 42/67  brain]
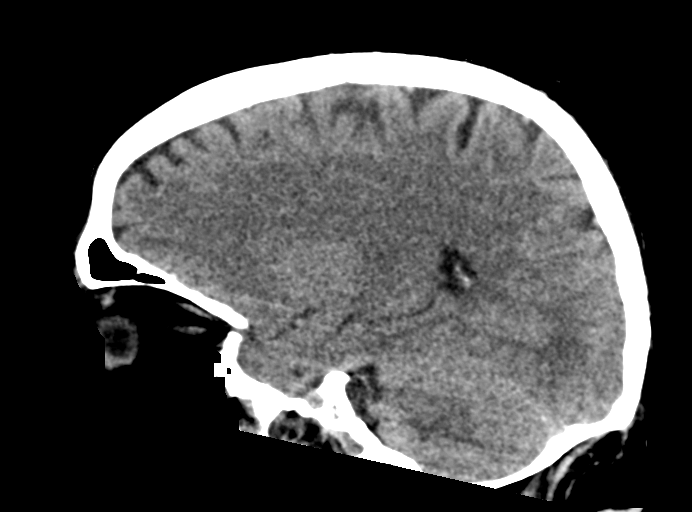

[Series 6: head wo · axial · 0.48mm/px · z∈[+95,+225]mm · 10 of 31 slices shown, 13 images]
[im 3/31  brain]
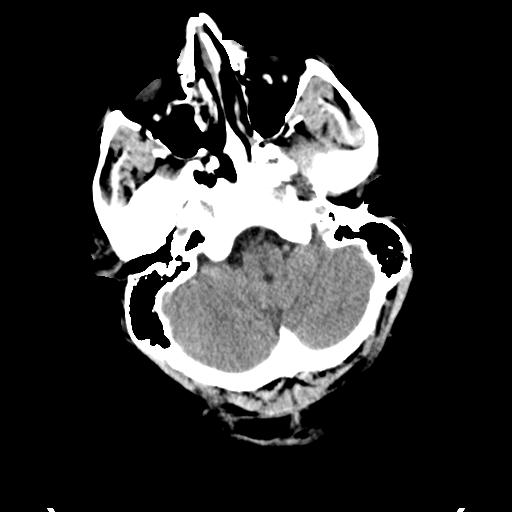
[im 3/31  bone]
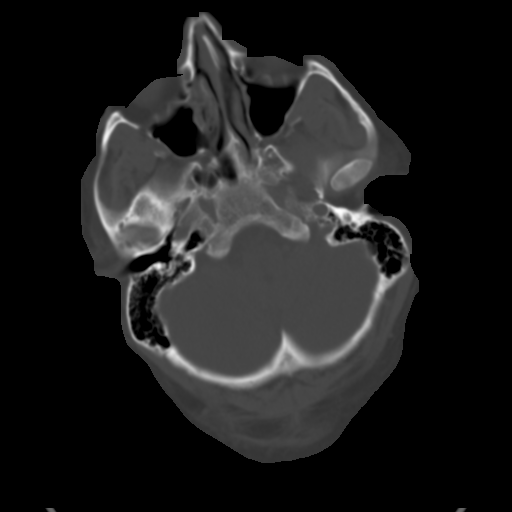
[im 7/31  brain]
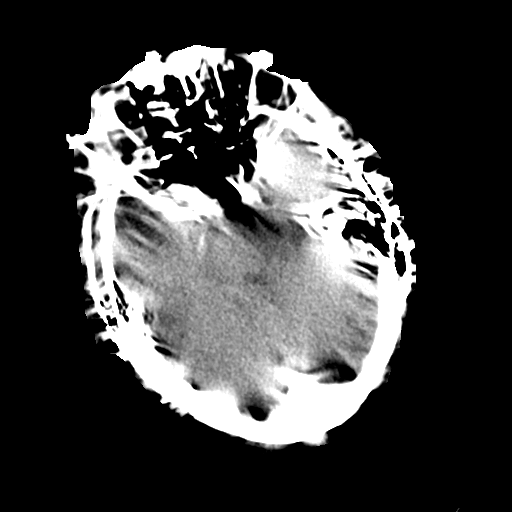
[im 9/31  brain]
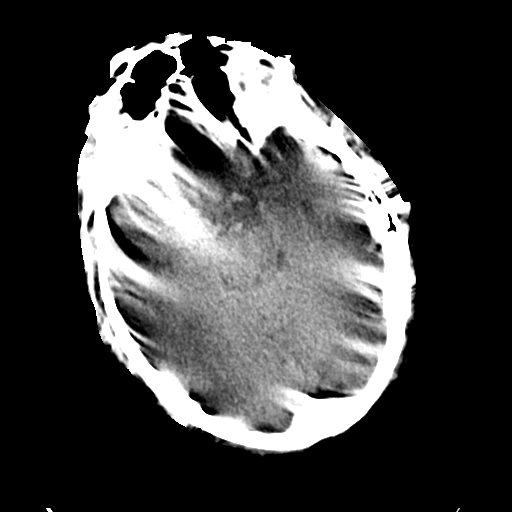
[im 11/31  brain]
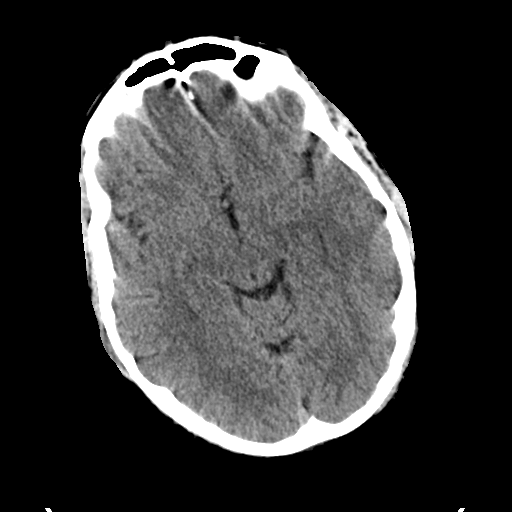
[im 15/31  brain]
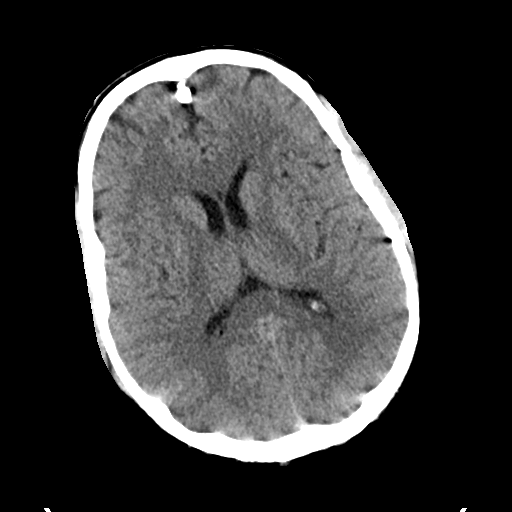
[im 15/31  bone]
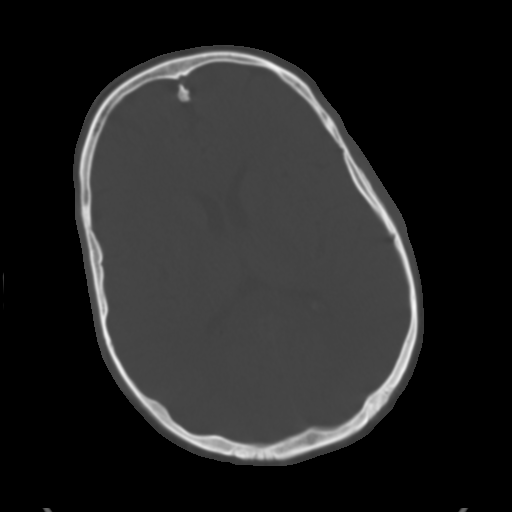
[im 17/31  brain]
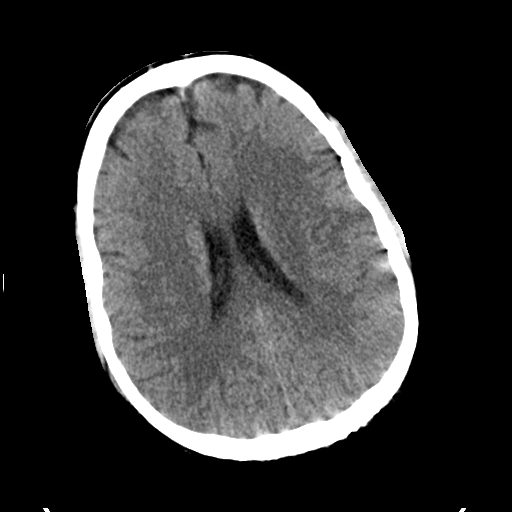
[im 21/31  brain]
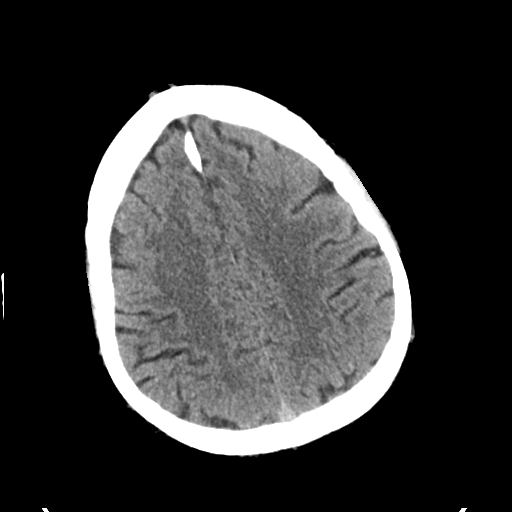
[im 23/31  brain]
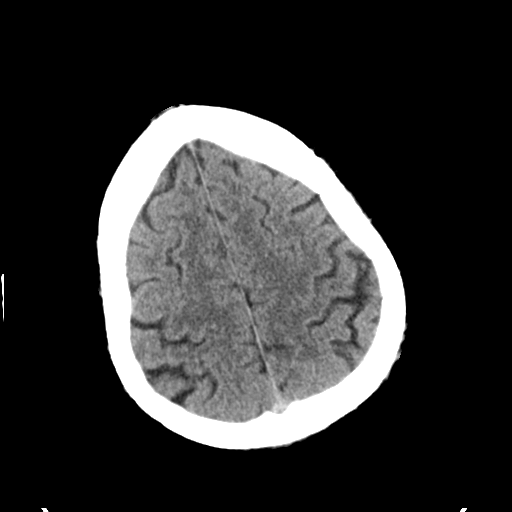
[im 25/31  brain]
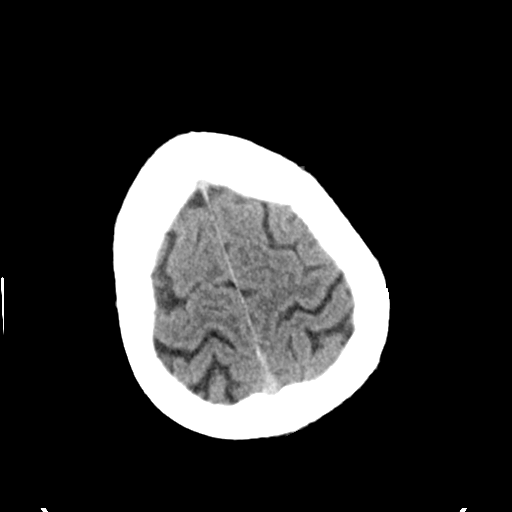
[im 25/31  bone]
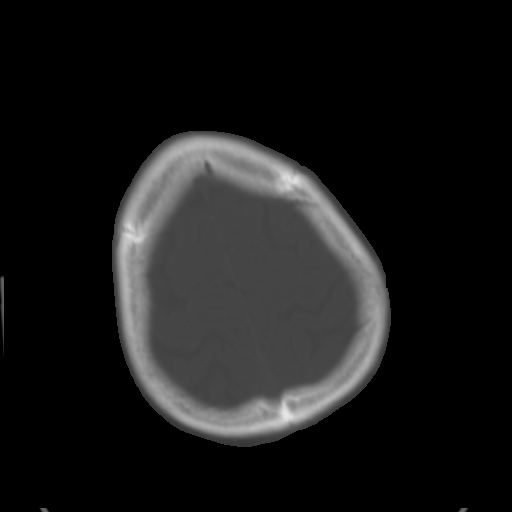
[im 29/31  brain]
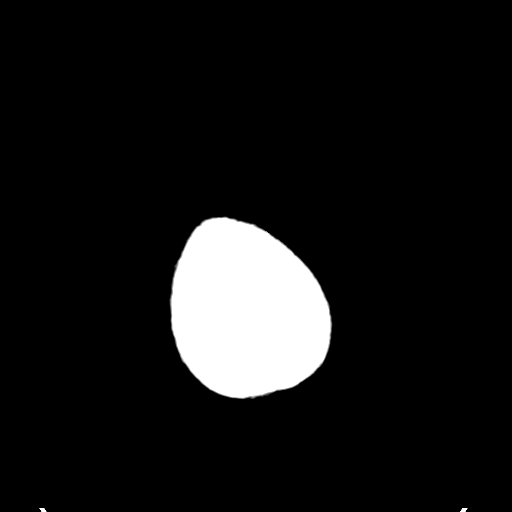

[16 of 47 positions shown; findings below may reference images not displayed]

FINDINGS: Brain: No evidence of acute infarction, hemorrhage, hydrocephalus,
extra-axial collection or mass lesion/mass effect. Normal cerebral
volume. No white matter disease.

Vascular: No hyperdense vessel or unexpected calcification.

Skull: Normal. Negative for fracture or focal lesion.

Sinuses/Orbits: No acute finding. Mild mucosal thickening LEFT
division sphenoid.

Other: None.
IMPRESSION: Negative exam.  Findings discussed with ordering provider.

## 2021-07-08 DIAGNOSIS — I10 Essential (primary) hypertension: Secondary | ICD-10-CM | POA: Diagnosis not present

## 2021-07-08 DIAGNOSIS — E7800 Pure hypercholesterolemia, unspecified: Secondary | ICD-10-CM | POA: Diagnosis not present

## 2021-07-08 DIAGNOSIS — E559 Vitamin D deficiency, unspecified: Secondary | ICD-10-CM | POA: Diagnosis not present

## 2021-07-08 DIAGNOSIS — E139 Other specified diabetes mellitus without complications: Secondary | ICD-10-CM | POA: Diagnosis not present

## 2021-11-10 DIAGNOSIS — E119 Type 2 diabetes mellitus without complications: Secondary | ICD-10-CM | POA: Diagnosis not present

## 2021-12-08 DIAGNOSIS — E119 Type 2 diabetes mellitus without complications: Secondary | ICD-10-CM | POA: Diagnosis not present

## 2022-01-08 DIAGNOSIS — E119 Type 2 diabetes mellitus without complications: Secondary | ICD-10-CM | POA: Diagnosis not present

## 2022-01-11 DIAGNOSIS — I10 Essential (primary) hypertension: Secondary | ICD-10-CM | POA: Diagnosis not present

## 2022-01-11 DIAGNOSIS — R509 Fever, unspecified: Secondary | ICD-10-CM | POA: Diagnosis not present

## 2022-01-11 DIAGNOSIS — E139 Other specified diabetes mellitus without complications: Secondary | ICD-10-CM | POA: Diagnosis not present

## 2022-01-11 DIAGNOSIS — E785 Hyperlipidemia, unspecified: Secondary | ICD-10-CM | POA: Diagnosis not present

## 2022-01-25 DIAGNOSIS — R8290 Unspecified abnormal findings in urine: Secondary | ICD-10-CM | POA: Diagnosis not present

## 2022-01-25 DIAGNOSIS — R319 Hematuria, unspecified: Secondary | ICD-10-CM | POA: Diagnosis not present

## 2022-01-25 DIAGNOSIS — Z113 Encounter for screening for infections with a predominantly sexual mode of transmission: Secondary | ICD-10-CM | POA: Diagnosis not present

## 2022-02-07 DIAGNOSIS — E119 Type 2 diabetes mellitus without complications: Secondary | ICD-10-CM | POA: Diagnosis not present

## 2022-02-21 NOTE — Progress Notes (Signed)
H&P ? ?Chief Complaint: Flank pain, blood in urine ? ?History of Present Illness: 46 year old male presents today for about a month long history of intermittent gross hematuria with right flank pain.  Family history of prostate cancer and of kidney stones.  Some lower urinary tract symptoms.  Has had documented microscopic hematuria recently as well.  Pain has not been severe.  Not associate with fever, chills, nausea or vomiting.  He is obviously anxious about prostate cancer as his father died from this within the past 2 years. ? ?He has not been screened for prostate cancer as of yet. ? ?Past Medical History:  ?Diagnosis Date  ? Diabetes mellitus without complication ([place])   ? Hypertension   ? Pneumonia 1980  ? ? ?Past Surgical History:  ?Procedure Laterality Date  ? CYST EXCISION Right ~ 2000  ? "big one on my lower leg"  ? ? ?Home Medications:  ?Allergies as of 02/22/2022   ? ?   Reactions  ? Aspirin Swelling  ? Penicillins Rash  ? ?  ? ?  ?Medication List  ?  ? ?  ? Accurate as of Feb 21, 2022  7:58 PM. If you have any questions, ask your nurse or doctor.  ?  ?  ? ?  ? ?benzonatate 100 MG capsule ?Commonly known as: TESSALON ?Take 1 capsule (100 mg total) by mouth every 8 (eight) hours. ?  ?cetirizine-pseudoephedrine 5-120 MG tablet ?Commonly known as: ZYRTEC-D ?Take 1 tablet by mouth daily. ?  ?chlorpheniramine-HYDROcodone 10-8 MG/5ML Suer ?Commonly known as: Tussionex Pennkinetic ER ?Take 5 mLs by mouth at bedtime as needed for cough. ?  ?fluticasone 50 MCG/ACT nasal spray ?Commonly known as: FLONASE ?Place 2 sprays into both nostrils daily. ?  ?HYDROCHLOROTHIAZIDE PO ?Take by mouth. ?  ?METFORMIN HCL PO ?Take by mouth. ?  ? ?  ? ? ?Allergies:  ?Allergies  ?Allergen Reactions  ? Aspirin Swelling  ? Penicillins Rash  ? ? ?No family history on file. ? ?Social History:  reports that he has never smoked. His smokeless tobacco use includes snuff. He reports current alcohol use. He reports that he does not use  drugs. ? ?ROS: ?A complete review of systems was performed.  All systems are negative except for pertinent findings as noted. ? ?Physical Exam:  ?Vital signs in last 24 hours: ?There were no vitals taken for this visit. ?Constitutional:  Alert and oriented, No acute distress ?Cardiovascular: Regular rate  ?Respiratory: Normal respiratory effort ?GI: Abdomen is obese, soft, nondistended, no abdominal masses. No CVAT.  Mild right lower quadrant tenderness ?Lymphatic: No lymphadenopathy ?Neurologic: Grossly intact, no focal deficits ?Psychiatric: Normal mood and affect ? ?I have reviewed prior pt notes ? ?I have reviewed notes from referring/previous physicians ? ?I have reviewed urinalysis results--microscopic hematuria present ? ? ?Impression/Assessment:  ?Intermittent gross/microscopic hematuria with right flank pain, rule out kidney stone ? ?Plan:  ?I reassured him that I do not think this is prostate cancer ? ?I will send him out for CT scan stone protocol.  Call with results and follow-up ? ?

## 2022-02-22 ENCOUNTER — Ambulatory Visit: Payer: BLUE CROSS/BLUE SHIELD | Admitting: Urology

## 2022-02-22 VITALS — BP 150/89 | HR 83

## 2022-02-22 DIAGNOSIS — Z841 Family history of disorders of kidney and ureter: Secondary | ICD-10-CM

## 2022-02-22 DIAGNOSIS — R1031 Right lower quadrant pain: Secondary | ICD-10-CM | POA: Diagnosis not present

## 2022-02-22 DIAGNOSIS — N5201 Erectile dysfunction due to arterial insufficiency: Secondary | ICD-10-CM

## 2022-02-22 DIAGNOSIS — R109 Unspecified abdominal pain: Secondary | ICD-10-CM

## 2022-02-22 DIAGNOSIS — Z8042 Family history of malignant neoplasm of prostate: Secondary | ICD-10-CM

## 2022-02-22 DIAGNOSIS — R310 Gross hematuria: Secondary | ICD-10-CM | POA: Diagnosis not present

## 2022-02-23 LAB — MICROSCOPIC EXAMINATION
Epithelial Cells (non renal): NONE SEEN /hpf (ref 0–10)
Renal Epithel, UA: NONE SEEN /hpf
WBC, UA: NONE SEEN /hpf (ref 0–5)

## 2022-02-23 LAB — URINALYSIS, ROUTINE W REFLEX MICROSCOPIC
Bilirubin, UA: NEGATIVE
Glucose, UA: NEGATIVE
Ketones, UA: NEGATIVE
Leukocytes,UA: NEGATIVE
Nitrite, UA: NEGATIVE
Protein,UA: NEGATIVE
Specific Gravity, UA: 1.025 (ref 1.005–1.030)
Urobilinogen, Ur: 0.2 mg/dL (ref 0.2–1.0)
pH, UA: 5.5 (ref 5.0–7.5)

## 2022-03-10 DIAGNOSIS — E119 Type 2 diabetes mellitus without complications: Secondary | ICD-10-CM | POA: Diagnosis not present

## 2022-03-25 ENCOUNTER — Ambulatory Visit (HOSPITAL_COMMUNITY): Payer: BC Managed Care – PPO

## 2022-04-09 DIAGNOSIS — E119 Type 2 diabetes mellitus without complications: Secondary | ICD-10-CM | POA: Diagnosis not present

## 2022-04-18 ENCOUNTER — Ambulatory Visit (HOSPITAL_COMMUNITY)
Admission: RE | Admit: 2022-04-18 | Discharge: 2022-04-18 | Disposition: A | Discharge status: Home / Self Care | Payer: BC Managed Care – PPO | Source: Ambulatory Visit | Attending: Urology | Admitting: Urology

## 2022-04-18 DIAGNOSIS — R1031 Right lower quadrant pain: Secondary | ICD-10-CM | POA: Insufficient documentation

## 2022-04-18 DIAGNOSIS — R109 Unspecified abdominal pain: Secondary | ICD-10-CM | POA: Diagnosis not present

## 2022-04-21 ENCOUNTER — Telehealth: Payer: Self-pay

## 2022-04-21 NOTE — Telephone Encounter (Signed)
Patient aware of results and scheduled on 07/18 at 1130a

## 2022-04-21 NOTE — Telephone Encounter (Signed)
-----   Message from Marcine Matar, MD sent at 04/21/2022  9:51 AM EDT ----- Call patient-looks like the pain and blood he was having were from a stone in his right ureter.  It is fairly large and might not pass on his own.  Have him worked in to see me on Tuesday. ----- Message ----- From: Grier Rocher, CMA Sent: 04/19/2022   5:23 PM EDT To: Marcine Matar, MD  Please review.

## 2022-04-25 NOTE — Progress Notes (Signed)
History of Present Illness:   5.16.2023: 46 year old male presents today for about a month long history of intermittent gross hematuria with right flank pain.  Family history of prostate cancer and of kidney stones.  Some lower urinary tract symptoms.  Has had documented microscopic hematuria recently as well.  Pain has not been severe.  Not associate with fever, chills, nausea or vomiting.  He is obviously anxious about prostate cancer as his father died from this within the past 2 years.  He has not been screened for prostate cancer as of yet.  7.11.2023: CT stone protocol--IMPRESSION: Probable 5 mm distal right ureteral calculus, without hydroureteronephrosis 4 mm nonobstructing left renal calculus. Hepatic steatosis.  7.18.2023: Here today for follow-up.  Still having urinary frequency and urgency.  No gross hematuria, no dysuria.  No recent flank pain.  CT reviewed-skin to stone distance 14 cm, Hounsfield units 750.    Past Medical History:  Diagnosis Date   Diabetes mellitus without complication (HCC)    Hypertension    Pneumonia 1980    Past Surgical History:  Procedure Laterality Date   CYST EXCISION Right ~ 2000   "big one on my lower leg"    Home Medications:  Allergies as of 04/26/2022       Reactions   Aspirin Swelling   Penicillins Rash        Medication List        Accurate as of April 25, 2022  8:27 PM. If you have any questions, ask your nurse or doctor.          benzonatate 100 MG capsule Commonly known as: TESSALON Take 1 capsule (100 mg total) by mouth every 8 (eight) hours.   cetirizine-pseudoephedrine 5-120 MG tablet Commonly known as: ZYRTEC-D Take 1 tablet by mouth daily.   chlorpheniramine-HYDROcodone 10-8 MG/5ML Suer Commonly known as: Tussionex Pennkinetic ER Take 5 mLs by mouth at bedtime as needed for cough.   ezetimibe 10 MG tablet Commonly known as: ZETIA Take 10 mg by mouth daily.   fenofibrate 160 MG tablet Take 160 mg  by mouth daily.   fluticasone 50 MCG/ACT nasal spray Commonly known as: FLONASE Place 2 sprays into both nostrils daily.   hydrochlorothiazide 12.5 MG capsule Commonly known as: MICROZIDE Take 12.5 mg by mouth daily.   Janumet 50-1000 MG tablet Generic drug: sitaGLIPtin-metformin Take 1 tablet by mouth 2 (two) times daily.   METFORMIN HCL PO Take by mouth.   montelukast 10 MG tablet Commonly known as: SINGULAIR Take 10 mg by mouth daily.   tadalafil 10 MG tablet Commonly known as: CIALIS Take 10 mg by mouth daily as needed.   tamsulosin 0.4 MG Caps capsule Commonly known as: FLOMAX Take 0.4 mg by mouth daily.        Allergies:  Allergies  Allergen Reactions   Aspirin Swelling   Penicillins Rash    No family history on file.  Social History:  reports that he has never smoked. His smokeless tobacco use includes snuff. He reports current alcohol use. He reports that he does not use drugs.  ROS: A complete review of systems was performed.  All systems are negative except for pertinent findings as noted.  Physical Exam:  Vital signs in last 24 hours: There were no vitals taken for this visit. Constitutional:  Alert and oriented, No acute distress Cardiovascular: Regular rate  Respiratory: Normal respiratory effort GI: Abdomen is soft, nontender, nondistended, no abdominal masses. No CVAT.  Genitourinary: Normal male phallus, testes  are descended bilaterally and non-tender and without masses, scrotum is normal in appearance without lesions or masses, perineum is normal on inspection. Lymphatic: No lymphadenopathy Neurologic: Grossly intact, no focal deficits Psychiatric: Normal mood and affect  I have reviewed prior pt notes  I have reviewed urinalysis results  I have independently reviewed prior imaging--images reviewed with the patient and his wife.   Impression/Assessment:  10 x 5 mm right distal ureteral stone with symptoms over the past 2 to 3  months  Plan:  I discussed treatment options with the patient-ureteroscopy with laser and shockwave lithotripsy.  Risks, complications, stone free rates discussed.  The patient would rather go with shockwave lithotripsy at this point.  As the stone is visible on scout film, I think it is fine to proceed with this.  We will get this scheduled in [place] as this can be done sooner than in [place].

## 2022-04-26 ENCOUNTER — Ambulatory Visit (INDEPENDENT_AMBULATORY_CARE_PROVIDER_SITE_OTHER): Payer: BC Managed Care – PPO | Admitting: Urology

## 2022-04-26 VITALS — BP 147/94 | HR 79 | Ht 73.0 in | Wt 280.0 lb

## 2022-04-26 DIAGNOSIS — N201 Calculus of ureter: Secondary | ICD-10-CM | POA: Diagnosis not present

## 2022-04-26 DIAGNOSIS — R310 Gross hematuria: Secondary | ICD-10-CM | POA: Diagnosis not present

## 2022-04-26 LAB — URINALYSIS, ROUTINE W REFLEX MICROSCOPIC
Bilirubin, UA: NEGATIVE
Glucose, UA: NEGATIVE
Ketones, UA: NEGATIVE
Leukocytes,UA: NEGATIVE
Nitrite, UA: NEGATIVE
Protein,UA: NEGATIVE
RBC, UA: NEGATIVE
Specific Gravity, UA: 1.025 (ref 1.005–1.030)
Urobilinogen, Ur: 0.2 mg/dL (ref 0.2–1.0)
pH, UA: 5 (ref 5.0–7.5)

## 2022-05-09 ENCOUNTER — Telehealth: Payer: Self-pay

## 2022-05-09 DIAGNOSIS — R109 Unspecified abdominal pain: Secondary | ICD-10-CM

## 2022-05-09 NOTE — Telephone Encounter (Signed)
See below Per your note pt was to be set up for litho in [place]. Patient states he has not heard from no one. Patient request to do litho asap. Patient unable to have done tomorrow in [place] due to him taking asa yesterday. Dr. Ronne Binning will not be in town next week to do it. Please advise.

## 2022-05-09 NOTE — Telephone Encounter (Signed)
Patient called and left a vm wanting to follow up with date and time of lithotripsy or when it might be scheduled. Patient advised he was seen 7/18 and his pain is getting worse.

## 2022-05-10 DIAGNOSIS — E119 Type 2 diabetes mellitus without complications: Secondary | ICD-10-CM | POA: Diagnosis not present

## 2022-05-11 NOTE — Telephone Encounter (Signed)
Patient called office today concerned that he has not heard from anyone for scheduling ESWL for kidney stone. Patient states last seen middle of July and would like to have this problem resolved.  Reviewed chart with Dr .Ronne Binning, no KUB in chart. Per Dr. Ronne Binning patient will have KUB completed this week and office visit scheduled for this Friday to discuss possible litho for 05/24/2022

## 2022-05-12 ENCOUNTER — Ambulatory Visit (HOSPITAL_COMMUNITY)
Admission: RE | Admit: 2022-05-12 | Discharge: 2022-05-12 | Disposition: A | Discharge status: Home / Self Care | Payer: BC Managed Care – PPO | Source: Ambulatory Visit | Attending: Urology | Admitting: Urology

## 2022-05-12 DIAGNOSIS — R109 Unspecified abdominal pain: Secondary | ICD-10-CM | POA: Insufficient documentation

## 2022-05-13 ENCOUNTER — Encounter: Payer: Self-pay | Admitting: Urology

## 2022-05-13 ENCOUNTER — Ambulatory Visit (INDEPENDENT_AMBULATORY_CARE_PROVIDER_SITE_OTHER): Payer: BC Managed Care – PPO | Admitting: Urology

## 2022-05-13 ENCOUNTER — Other Ambulatory Visit: Payer: Self-pay

## 2022-05-13 DIAGNOSIS — N201 Calculus of ureter: Secondary | ICD-10-CM

## 2022-05-13 LAB — URINALYSIS, ROUTINE W REFLEX MICROSCOPIC
Bilirubin, UA: NEGATIVE
Glucose, UA: NEGATIVE
Ketones, UA: NEGATIVE
Leukocytes,UA: NEGATIVE
Nitrite, UA: NEGATIVE
Protein,UA: NEGATIVE
Specific Gravity, UA: 1.02 (ref 1.005–1.030)
Urobilinogen, Ur: 0.2 mg/dL (ref 0.2–1.0)
pH, UA: 5 (ref 5.0–7.5)

## 2022-05-13 LAB — MICROSCOPIC EXAMINATION
Epithelial Cells (non renal): NONE SEEN /hpf (ref 0–10)
Renal Epithel, UA: NONE SEEN /hpf
WBC, UA: NONE SEEN /hpf (ref 0–5)

## 2022-05-13 MED ORDER — TAMSULOSIN HCL 0.4 MG PO CAPS: 0.4000 mg | ORAL_CAPSULE | Freq: Every day | ORAL | 0 refills | Status: DC

## 2022-05-13 NOTE — Patient Instructions (Signed)

## 2022-05-13 NOTE — H&P (View-Only) (Signed)
05/13/2022 9:45 AM   Ethan Logan 02/03/1976 [id number]  Referring provider: [facility], [facility] [address] [place],  [place] [address]  Right ureteral calculus   HPI: Ethan Logan is a 46yo here for followup for a right ureteral calculus. He has not passed his calculus. KUB from today shows a right distal ureteral calculus. He has mild right flank pain. He has urinary urgency and frequency. Urine stream strong. No gross hematuria. No dysuria. UA today shows 11-30 RBCs/hpf.   PMH: Past Medical History:  Diagnosis Date   Diabetes mellitus without complication (HCC)    Hypertension    Pneumonia 1980    Surgical History: Past Surgical History:  Procedure Laterality Date   CYST EXCISION Right ~ 2000   "big one on my lower leg"    Home Medications:  Allergies as of 05/13/2022       Reactions   Aspirin Swelling   Penicillins Rash        Medication List        Accurate as of May 13, 2022  9:45 AM. If you have any questions, ask your nurse or doctor.          benzonatate 100 MG capsule Commonly known as: TESSALON Take 1 capsule (100 mg total) by mouth every 8 (eight) hours.   cetirizine-pseudoephedrine 5-120 MG tablet Commonly known as: ZYRTEC-D Take 1 tablet by mouth daily.   chlorpheniramine-HYDROcodone 10-8 MG/5ML Suer Commonly known as: Tussionex Pennkinetic ER Take 5 mLs by mouth at bedtime as needed for cough.   ezetimibe 10 MG tablet Commonly known as: ZETIA Take 10 mg by mouth daily.   fenofibrate 160 MG tablet Take 160 mg by mouth daily.   fluticasone 50 MCG/ACT nasal spray Commonly known as: FLONASE Place 2 sprays into both nostrils daily.   hydrochlorothiazide 12.5 MG capsule Commonly known as: MICROZIDE Take 12.5 mg by mouth daily.   Janumet 50-1000 MG tablet Generic drug: sitaGLIPtin-metformin Take 1 tablet by mouth 2 (two) times daily.   METFORMIN HCL PO Take by mouth.   montelukast 10 MG  tablet Commonly known as: SINGULAIR Take 10 mg by mouth daily.   tadalafil 10 MG tablet Commonly known as: CIALIS Take 10 mg by mouth daily as needed.   tamsulosin 0.4 MG Caps capsule Commonly known as: FLOMAX Take 0.4 mg by mouth daily.        Allergies:  Allergies  Allergen Reactions   Aspirin Swelling   Penicillins Rash    Family History: No family history on file.  Social History:  reports that he has never smoked. His smokeless tobacco use includes snuff. He reports current alcohol use. He reports that he does not use drugs.  ROS: All other review of systems were reviewed and are negative except what is noted above in HPI  Physical Exam: There were no vitals taken for this visit.  Constitutional:  Alert and oriented, No acute distress. HEENT: [place] AT, moist mucus membranes.  Trachea midline, no masses. Cardiovascular: No clubbing, cyanosis, or edema. Respiratory: Normal respiratory effort, no increased work of breathing. GI: Abdomen is soft, nontender, nondistended, no abdominal masses GU: No CVA tenderness.  Lymph: No cervical or inguinal lymphadenopathy. Skin: No rashes, bruises or suspicious lesions. Neurologic: Grossly intact, no focal deficits, moving all 4 extremities. Psychiatric: Normal mood and affect.  Laboratory Data: Lab Results  Component Value Date   WBC 5.7 09/13/2016   HGB 13.2 09/13/2016   HCT 39.2 09/13/2016  MCV 86.5 09/13/2016   PLT 161 09/13/2016    Lab Results  Component Value Date   CREATININE 0.87 09/13/2016    No results found for: "PSA"  No results found for: "TESTOSTERONE"  No results found for: "HGBA1C"  Urinalysis    Component Value Date/Time   COLORURINE YELLOW 09/11/2016 0903   APPEARANCEUR Clear 04/26/2022 1139   LABSPEC 1.015 09/11/2016 0903   PHURINE 7.0 09/11/2016 0903   GLUCOSEU Negative 04/26/2022 1139   HGBUR NEGATIVE 09/11/2016 0903   BILIRUBINUR Negative 04/26/2022 1139   KETONESUR TRACE (A)  09/11/2016 0903   PROTEINUR Negative 04/26/2022 1139   PROTEINUR TRACE (A) 09/11/2016 0903   NITRITE Negative 04/26/2022 1139   NITRITE NEGATIVE 09/11/2016 0903   LEUKOCYTESUR Negative 04/26/2022 1139    Lab Results  Component Value Date   LABMICR Comment 04/26/2022   WBCUA None seen 02/22/2022   LABEPIT None seen 02/22/2022   MUCUS Present 02/22/2022   BACTERIA Few 02/22/2022    Pertinent Imaging: KUB today: Images reviewed and discussed with patient  Results for orders placed during the hospital encounter of 05/12/22  DG Abd 1 View  Narrative CLINICAL DATA:  Kidney stone.  Pain with urination.  Blood in urine.  EXAM: ABDOMEN - 1 VIEW  COMPARISON:  None Available.  FINDINGS: Both kidneys are significantly obscured by bowel contents. No renal stones identified. An oval density in the right pelvis could represent bowel contents versus a retained stone in the distal ureter. No other abnormalities. Mild fecal loading in the colon, particularly the right colon.  IMPRESSION: 1. No renal stones identified. 2. An oval density in the right pelvis could represent a retained stone in the distal ureter versus bowel contents. CT imaging could better evaluate. 3. No other abnormalities.   Electronically Signed By: David  Williams III M.D. On: 05/12/2022 17:03  No results found for this or any previous visit.  No results found for this or any previous visit.  No results found for this or any previous visit.  No results found for this or any previous visit.  No results found for this or any previous visit.  No results found for this or any previous visit.  No results found for this or any previous visit.   Assessment & Plan:    1. Ureteric stone -We discussed the management of kidney stones. These options include observation, ureteroscopy, shockwave lithotripsy (ESWL) and percutaneous nephrolithotomy (PCNL). We discussed which options are relevant to the patient's  stone(s). We discussed the natural history of kidney stones as well as the complications of untreated stones and the impact on quality of life without treatment as well as with each of the above listed treatments. We also discussed the efficacy of each treatment in its ability to clear the stone burden. With any of these management options I discussed the signs and symptoms of infection and the need for emergent treatment should these be experienced. For each option we discussed the ability of each procedure to clear the patient of their stone burden.   For observation I described the risks which include but are not limited to silent renal damage, life-threatening infection, need for emergent surgery, failure to pass stone and pain.   For ureteroscopy I described the risks which include bleeding, infection, damage to contiguous structures, positioning injury, ureteral stricture, ureteral avulsion, ureteral injury, need for prolonged ureteral stent, inability to perform ureteroscopy, need for an interval procedure, inability to clear stone burden, stent discomfort/pain, heart attack, stroke, pulmonary   embolus and the inherent risks with general anesthesia.   For shockwave lithotripsy I described the risks which include arrhythmia, kidney contusion, kidney hemorrhage, need for transfusion, pain, inability to adequately break up stone, inability to pass stone fragments, Steinstrasse, infection associated with obstructing stones, need for alternate surgical procedure, need for repeat shockwave lithotripsy, MI, CVA, PE and the inherent risks with anesthesia/conscious sedation.   For PCNL I described the risks including positioning injury, pneumothorax, hydrothorax, need for chest tube, inability to clear stone burden, renal laceration, arterial venous fistula or malformation, need for embolization of kidney, loss of kidney or renal function, need for repeat procedure, need for prolonged nephrostomy tube, ureteral  avulsion, MI, CVA, PE and the inherent risks of general anesthesia.   - The patient would like to proceed with right ESWL  - Urinalysis, Routine w reflex microscopic   No follow-ups on file.  Wilkie Aye, MD  [facility]

## 2022-05-13 NOTE — Progress Notes (Signed)
05/13/2022 9:45 AM   Kirtland Bouchard 02/03/1976 [id number]  Referring provider: [facility], [facility] [address] [place],  [place] [address]  Right ureteral calculus   HPI: Mr Ethan Logan is a 46yo here for followup for a right ureteral calculus. He has not passed his calculus. KUB from today shows a right distal ureteral calculus. He has mild right flank pain. He has urinary urgency and frequency. Urine stream strong. No gross hematuria. No dysuria. UA today shows 11-30 RBCs/hpf.   PMH: Past Medical History:  Diagnosis Date   Diabetes mellitus without complication (HCC)    Hypertension    Pneumonia 1980    Surgical History: Past Surgical History:  Procedure Laterality Date   CYST EXCISION Right ~ 2000   "big one on my lower leg"    Home Medications:  Allergies as of 05/13/2022       Reactions   Aspirin Swelling   Penicillins Rash        Medication List        Accurate as of May 13, 2022  9:45 AM. If you have any questions, ask your nurse or doctor.          benzonatate 100 MG capsule Commonly known as: TESSALON Take 1 capsule (100 mg total) by mouth every 8 (eight) hours.   cetirizine-pseudoephedrine 5-120 MG tablet Commonly known as: ZYRTEC-D Take 1 tablet by mouth daily.   chlorpheniramine-HYDROcodone 10-8 MG/5ML Suer Commonly known as: Tussionex Pennkinetic ER Take 5 mLs by mouth at bedtime as needed for cough.   ezetimibe 10 MG tablet Commonly known as: ZETIA Take 10 mg by mouth daily.   fenofibrate 160 MG tablet Take 160 mg by mouth daily.   fluticasone 50 MCG/ACT nasal spray Commonly known as: FLONASE Place 2 sprays into both nostrils daily.   hydrochlorothiazide 12.5 MG capsule Commonly known as: MICROZIDE Take 12.5 mg by mouth daily.   Janumet 50-1000 MG tablet Generic drug: sitaGLIPtin-metformin Take 1 tablet by mouth 2 (two) times daily.   METFORMIN HCL PO Take by mouth.   montelukast 10 MG  tablet Commonly known as: SINGULAIR Take 10 mg by mouth daily.   tadalafil 10 MG tablet Commonly known as: CIALIS Take 10 mg by mouth daily as needed.   tamsulosin 0.4 MG Caps capsule Commonly known as: FLOMAX Take 0.4 mg by mouth daily.        Allergies:  Allergies  Allergen Reactions   Aspirin Swelling   Penicillins Rash    Family History: No family history on file.  Social History:  reports that he has never smoked. His smokeless tobacco use includes snuff. He reports current alcohol use. He reports that he does not use drugs.  ROS: All other review of systems were reviewed and are negative except what is noted above in HPI  Physical Exam: There were no vitals taken for this visit.  Constitutional:  Alert and oriented, No acute distress. HEENT: [place] AT, moist mucus membranes.  Trachea midline, no masses. Cardiovascular: No clubbing, cyanosis, or edema. Respiratory: Normal respiratory effort, no increased work of breathing. GI: Abdomen is soft, nontender, nondistended, no abdominal masses GU: No CVA tenderness.  Lymph: No cervical or inguinal lymphadenopathy. Skin: No rashes, bruises or suspicious lesions. Neurologic: Grossly intact, no focal deficits, moving all 4 extremities. Psychiatric: Normal mood and affect.  Laboratory Data: Lab Results  Component Value Date   WBC 5.7 09/13/2016   HGB 13.2 09/13/2016   HCT 39.2 09/13/2016  MCV 86.5 09/13/2016   PLT 161 09/13/2016    Lab Results  Component Value Date   CREATININE 0.87 09/13/2016    No results found for: "PSA"  No results found for: "TESTOSTERONE"  No results found for: "HGBA1C"  Urinalysis    Component Value Date/Time   COLORURINE YELLOW 09/11/2016 0903   APPEARANCEUR Clear 04/26/2022 1139   LABSPEC 1.015 09/11/2016 0903   PHURINE 7.0 09/11/2016 0903   GLUCOSEU Negative 04/26/2022 1139   HGBUR NEGATIVE 09/11/2016 0903   BILIRUBINUR Negative 04/26/2022 1139   KETONESUR TRACE (A)  09/11/2016 0903   PROTEINUR Negative 04/26/2022 1139   PROTEINUR TRACE (A) 09/11/2016 0903   NITRITE Negative 04/26/2022 1139   NITRITE NEGATIVE 09/11/2016 0903   LEUKOCYTESUR Negative 04/26/2022 1139    Lab Results  Component Value Date   LABMICR Comment 04/26/2022   WBCUA None seen 02/22/2022   LABEPIT None seen 02/22/2022   MUCUS Present 02/22/2022   BACTERIA Few 02/22/2022    Pertinent Imaging: KUB today: Images reviewed and discussed with patient  Results for orders placed during the hospital encounter of 05/12/22  DG Abd 1 View  Narrative CLINICAL DATA:  Kidney stone.  Pain with urination.  Blood in urine.  EXAM: ABDOMEN - 1 VIEW  COMPARISON:  None Available.  FINDINGS: Both kidneys are significantly obscured by bowel contents. No renal stones identified. An oval density in the right pelvis could represent bowel contents versus a retained stone in the distal ureter. No other abnormalities. Mild fecal loading in the colon, particularly the right colon.  IMPRESSION: 1. No renal stones identified. 2. An oval density in the right pelvis could represent a retained stone in the distal ureter versus bowel contents. CT imaging could better evaluate. 3. No other abnormalities.   Electronically Signed By: Gerome Sam III M.D. On: 05/12/2022 17:03  No results found for this or any previous visit.  No results found for this or any previous visit.  No results found for this or any previous visit.  No results found for this or any previous visit.  No results found for this or any previous visit.  No results found for this or any previous visit.  No results found for this or any previous visit.   Assessment & Plan:    1. Ureteric stone -We discussed the management of kidney stones. These options include observation, ureteroscopy, shockwave lithotripsy (ESWL) and percutaneous nephrolithotomy (PCNL). We discussed which options are relevant to the patient's  stone(s). We discussed the natural history of kidney stones as well as the complications of untreated stones and the impact on quality of life without treatment as well as with each of the above listed treatments. We also discussed the efficacy of each treatment in its ability to clear the stone burden. With any of these management options I discussed the signs and symptoms of infection and the need for emergent treatment should these be experienced. For each option we discussed the ability of each procedure to clear the patient of their stone burden.   For observation I described the risks which include but are not limited to silent renal damage, life-threatening infection, need for emergent surgery, failure to pass stone and pain.   For ureteroscopy I described the risks which include bleeding, infection, damage to contiguous structures, positioning injury, ureteral stricture, ureteral avulsion, ureteral injury, need for prolonged ureteral stent, inability to perform ureteroscopy, need for an interval procedure, inability to clear stone burden, stent discomfort/pain, heart attack, stroke, pulmonary  embolus and the inherent risks with general anesthesia.   For shockwave lithotripsy I described the risks which include arrhythmia, kidney contusion, kidney hemorrhage, need for transfusion, pain, inability to adequately break up stone, inability to pass stone fragments, Steinstrasse, infection associated with obstructing stones, need for alternate surgical procedure, need for repeat shockwave lithotripsy, MI, CVA, PE and the inherent risks with anesthesia/conscious sedation.   For PCNL I described the risks including positioning injury, pneumothorax, hydrothorax, need for chest tube, inability to clear stone burden, renal laceration, arterial venous fistula or malformation, need for embolization of kidney, loss of kidney or renal function, need for repeat procedure, need for prolonged nephrostomy tube, ureteral  avulsion, MI, CVA, PE and the inherent risks of general anesthesia.   - The patient would like to proceed with right ESWL  - Urinalysis, Routine w reflex microscopic   No follow-ups on file.  Wilkie Aye, MD  [facility]

## 2022-05-23 ENCOUNTER — Encounter (HOSPITAL_COMMUNITY)
Admission: RE | Admit: 2022-05-23 | Discharge: 2022-05-23 | Disposition: A | Discharge status: Home / Self Care | Payer: BC Managed Care – PPO | Source: Ambulatory Visit | Attending: Urology | Admitting: Urology

## 2022-05-23 ENCOUNTER — Encounter (HOSPITAL_COMMUNITY): Payer: Self-pay

## 2022-05-23 ENCOUNTER — Other Ambulatory Visit: Payer: Self-pay

## 2022-05-24 ENCOUNTER — Encounter (HOSPITAL_COMMUNITY): Payer: Self-pay | Admitting: Urology

## 2022-05-24 ENCOUNTER — Ambulatory Visit (HOSPITAL_COMMUNITY)
Admission: RE | Admit: 2022-05-24 | Discharge: 2022-05-24 | Disposition: A | Discharge status: Home / Self Care | Payer: BC Managed Care – PPO | Source: Ambulatory Visit | Attending: Urology | Admitting: Urology

## 2022-05-24 ENCOUNTER — Ambulatory Visit (HOSPITAL_COMMUNITY): Payer: BC Managed Care – PPO

## 2022-05-24 ENCOUNTER — Encounter (HOSPITAL_COMMUNITY)
Admission: RE | Disposition: A | Discharge status: Home / Self Care | Payer: Self-pay | Source: Ambulatory Visit | Attending: Urology

## 2022-05-24 DIAGNOSIS — N201 Calculus of ureter: Secondary | ICD-10-CM | POA: Diagnosis not present

## 2022-05-24 DIAGNOSIS — N200 Calculus of kidney: Secondary | ICD-10-CM | POA: Insufficient documentation

## 2022-05-24 DIAGNOSIS — N2889 Other specified disorders of kidney and ureter: Secondary | ICD-10-CM | POA: Diagnosis not present

## 2022-05-24 DIAGNOSIS — Z87442 Personal history of urinary calculi: Secondary | ICD-10-CM | POA: Diagnosis not present

## 2022-05-24 HISTORY — PX: EXTRACORPOREAL SHOCK WAVE LITHOTRIPSY: SHX1557

## 2022-05-24 LAB — GLUCOSE, CAPILLARY: Glucose-Capillary: 137 mg/dL — ABNORMAL HIGH (ref 70–99)

## 2022-05-24 SURGERY — LITHOTRIPSY, ESWL
Anesthesia: LOCAL | Laterality: Right

## 2022-05-24 MED ORDER — DIAZEPAM 5 MG PO TABS
10.0000 mg | ORAL_TABLET | Freq: Once | ORAL | Status: AC
  Administered 2022-05-24: 10 mg via ORAL
  Filled 2022-05-24: qty 2

## 2022-05-24 MED ORDER — SODIUM CHLORIDE 0.9 % IV SOLN
INTRAVENOUS | Status: DC
  Administered 2022-05-24: 1000 mL via INTRAVENOUS

## 2022-05-24 MED ORDER — DIPHENHYDRAMINE HCL 25 MG PO CAPS
25.0000 mg | ORAL_CAPSULE | ORAL | Status: AC
  Administered 2022-05-24: 25 mg via ORAL
  Filled 2022-05-24: qty 1

## 2022-05-24 MED ORDER — HYDROCODONE-ACETAMINOPHEN 5-325 MG PO TABS: 1.0000 | ORAL_TABLET | Freq: Four times a day (QID) | ORAL | 0 refills | Status: DC | PRN

## 2022-05-24 NOTE — Interval H&P Note (Signed)
History and Physical Interval Note:  05/24/2022 11:04 AM  Ethan Logan  has presented today for surgery, with the diagnosis of right ureteral calculus.  The various methods of treatment have been discussed with the patient and family. After consideration of risks, benefits and other options for treatment, the patient has consented to  Procedure(s): EXTRACORPOREAL SHOCK WAVE LITHOTRIPSY (ESWL) (Right) as a surgical intervention.  The patient's history has been reviewed, patient examined, no change in status, stable for surgery.  I have reviewed the patient's chart and labs.  Questions were answered to the patient's satisfaction.     Di Kindle

## 2022-05-25 ENCOUNTER — Encounter (HOSPITAL_COMMUNITY): Payer: Self-pay | Admitting: Urology

## 2022-06-07 ENCOUNTER — Telehealth: Payer: Self-pay

## 2022-06-07 ENCOUNTER — Ambulatory Visit: Payer: BC Managed Care – PPO | Admitting: Physician Assistant

## 2022-06-07 NOTE — Telephone Encounter (Signed)
-----   Message from [facility] [place], [place] sent at 06/07/2022 10:32 AM EDT ----- Pt cancelled appt for ESL FU and did not go for KUB. Please let pt know he needs KUB and OV to ensure stone fragments are passing as expected. 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

## 2022-06-07 NOTE — Telephone Encounter (Signed)
Patient is schedule to come in on 09/05 for office visit. Made patient aware to go to [facility] for KUB prior to appt. Patient voiced understanding.

## 2022-06-10 DIAGNOSIS — E119 Type 2 diabetes mellitus without complications: Secondary | ICD-10-CM | POA: Diagnosis not present

## 2022-06-14 ENCOUNTER — Ambulatory Visit (INDEPENDENT_AMBULATORY_CARE_PROVIDER_SITE_OTHER): Payer: BC Managed Care – PPO | Admitting: Physician Assistant

## 2022-06-14 ENCOUNTER — Ambulatory Visit (HOSPITAL_COMMUNITY)
Admission: RE | Admit: 2022-06-14 | Discharge: 2022-06-14 | Disposition: A | Discharge status: Home / Self Care | Payer: BC Managed Care – PPO | Source: Ambulatory Visit | Attending: Urology | Admitting: Urology

## 2022-06-14 VITALS — BP 126/80 | HR 82

## 2022-06-14 DIAGNOSIS — N201 Calculus of ureter: Secondary | ICD-10-CM

## 2022-06-14 DIAGNOSIS — Z87442 Personal history of urinary calculi: Secondary | ICD-10-CM | POA: Diagnosis not present

## 2022-06-14 NOTE — Progress Notes (Signed)
Assessment: 1. Ureteric stone    Plan: Stone prevention again discussed. Maintain adequate hydration. FU in 6 weeks after Renal US. Call or return if pt has concerns prior to next OV.    Chief Complaint: No chief complaint on file.   HPI: Ethan Logan is a 46 y.o. male who presents for post op evaluation of  stone tx with ESWL for distal right ureteral stone on 05/24/22. He's no longer having pain and reports no voiding issues. Occasional hematuria post ESL. Pt did not collect stones for analysis. UA= clear KUB: Previous stone in right pelvis no longer noted  05/13/22 Ethan Logan is a 46yo here for followup for a right ureteral calculus. He has not passed his calculus. KUB from today shows a right distal ureteral calculus. He has mild right flank pain. He has urinary urgency and frequency. Urine stream strong. No gross hematuria. No dysuria. UA today shows 11-30 RBCs/hpf.  Portions of the above documentation were copied from a prior visit for review purposes only.  Allergies: Allergies  Allergen Reactions   Aspirin Swelling   Penicillins Rash    PMH: Past Medical History:  Diagnosis Date   Diabetes mellitus without complication (HCC)    Hypertension    Pneumonia 1980    PSH: Past Surgical History:  Procedure Laterality Date   CYST EXCISION Right ~ 2000   "big one on my lower leg"   EXTRACORPOREAL SHOCK WAVE LITHOTRIPSY Right 05/24/2022   Procedure: EXTRACORPOREAL SHOCK WAVE LITHOTRIPSY (ESWL);  Surgeon: Milderd Meager., MD;  Location: [facility];  Service: Urology;  Laterality: Right;    SH: Social History   Tobacco Use   Smoking status: Never   Smokeless tobacco: Current    Types: Snuff  Substance Use Topics   Alcohol use: Yes    Comment: 09/12/2016 "might drink once/year; might not"   Drug use: No    ROS: All other review of systems were reviewed and are negative except what is noted above in HPI  PE: BP 126/80   Pulse 82  GENERAL APPEARANCE:   Well appearing, well developed, NAD HEENT:  Atraumatic, normocephalic NECK:  Supple. Trachea midline ABDOMEN:  Soft, non-tender, no masses, no CVAT EXTREMITIES:  Moves all extremities well NEUROLOGIC:  Alert and oriented x 3 MENTAL STATUS:  appropriate SKIN:  Warm, dry, and intact   Results: Laboratory Data: Lab Results  Component Value Date   WBC 5.7 09/13/2016   HGB 13.2 09/13/2016   HCT 39.2 09/13/2016   MCV 86.5 09/13/2016   PLT 161 09/13/2016    Lab Results  Component Value Date   CREATININE 0.87 09/13/2016     Urinalysis    Component Value Date/Time   COLORURINE YELLOW 09/11/2016 0903   APPEARANCEUR Clear 05/13/2022 1110   LABSPEC 1.015 09/11/2016 0903   PHURINE 7.0 09/11/2016 0903   GLUCOSEU Negative 05/13/2022 1110   HGBUR NEGATIVE 09/11/2016 0903   BILIRUBINUR Negative 05/13/2022 1110   KETONESUR TRACE (A) 09/11/2016 0903   PROTEINUR Negative 05/13/2022 1110   PROTEINUR TRACE (A) 09/11/2016 0903   NITRITE Negative 05/13/2022 1110   NITRITE NEGATIVE 09/11/2016 0903   LEUKOCYTESUR Negative 05/13/2022 1110    Lab Results  Component Value Date   LABMICR See below: 05/13/2022   WBCUA None seen 05/13/2022   LABEPIT None seen 05/13/2022   MUCUS Present 05/13/2022   BACTERIA Few 05/13/2022    Pertinent Imaging: Results for orders placed during the hospital encounter of 05/24/22  DG Abd 1  View  Narrative CLINICAL DATA:  Renal stones  EXAM: ABDOMEN - 1 VIEW  COMPARISON:  Previous radiographs done on 05/12/2022 and CT done on 04/18/2022  FINDINGS: Bowel gas pattern is nonspecific. Moderate to large amount of stool is seen in colon, especially in ascending colon. There is a faint 4 mm calcific density overlying the midportion of left kidney. There is a 5 mm calcific density in the right side of pelvis.  IMPRESSION: There is faint 4 mm calcific density in the midportion of left kidney, possibly renal stone. 5 mm calcific density in right side  of pelvis may suggest vascular calcification or ureteral calculus.  Nonspecific bowel gas pattern.   Electronically Signed By: Ernie Avena M.D. On: 05/24/2022 10:18  No results found for this or any previous visit.  No results found for this or any previous visit.  No results found for this or any previous visit.  No results found for this or any previous visit.  No results found for this or any previous visit.  No results found for this or any previous visit.  No results found for this or any previous visit.  No results found for this or any previous visit (from the past 24 hour(s)).

## 2022-06-14 NOTE — Addendum Note (Signed)
Addended by: Grier Rocher on: 06/14/2022 12:11 PM   Modules accepted: Orders

## 2022-06-15 ENCOUNTER — Ambulatory Visit: Payer: BC Managed Care – PPO | Admitting: Physician Assistant

## 2022-06-28 LAB — URINALYSIS, ROUTINE W REFLEX MICROSCOPIC
Bilirubin, UA: NEGATIVE
Glucose, UA: NEGATIVE
Ketones, UA: NEGATIVE
Leukocytes,UA: NEGATIVE
Nitrite, UA: NEGATIVE
Protein,UA: NEGATIVE
RBC, UA: NEGATIVE
Specific Gravity, UA: 1.025 (ref 1.005–1.030)
Urobilinogen, Ur: 0.2 mg/dL (ref 0.2–1.0)
pH, UA: 5 (ref 5.0–7.5)

## 2022-07-10 DIAGNOSIS — E119 Type 2 diabetes mellitus without complications: Secondary | ICD-10-CM | POA: Diagnosis not present

## 2022-07-25 ENCOUNTER — Ambulatory Visit: Payer: BC Managed Care – PPO | Admitting: Physician Assistant

## 2022-08-10 DIAGNOSIS — E119 Type 2 diabetes mellitus without complications: Secondary | ICD-10-CM | POA: Diagnosis not present

## 2022-09-09 DIAGNOSIS — E119 Type 2 diabetes mellitus without complications: Secondary | ICD-10-CM | POA: Diagnosis not present

## 2022-10-10 DIAGNOSIS — E119 Type 2 diabetes mellitus without complications: Secondary | ICD-10-CM | POA: Diagnosis not present

## 2022-11-10 DIAGNOSIS — E119 Type 2 diabetes mellitus without complications: Secondary | ICD-10-CM | POA: Diagnosis not present

## 2023-02-08 DIAGNOSIS — E119 Type 2 diabetes mellitus without complications: Secondary | ICD-10-CM | POA: Diagnosis not present

## 2023-03-11 DIAGNOSIS — E119 Type 2 diabetes mellitus without complications: Secondary | ICD-10-CM | POA: Diagnosis not present

## 2023-03-15 DIAGNOSIS — E785 Hyperlipidemia, unspecified: Secondary | ICD-10-CM | POA: Diagnosis not present

## 2023-03-15 DIAGNOSIS — E139 Other specified diabetes mellitus without complications: Secondary | ICD-10-CM | POA: Diagnosis not present

## 2023-03-15 DIAGNOSIS — I10 Essential (primary) hypertension: Secondary | ICD-10-CM | POA: Diagnosis not present

## 2023-04-10 DIAGNOSIS — E119 Type 2 diabetes mellitus without complications: Secondary | ICD-10-CM | POA: Diagnosis not present

## 2023-05-07 ENCOUNTER — Emergency Department: Payer: BC Managed Care – PPO

## 2023-05-07 ENCOUNTER — Emergency Department
Admission: EM | Admit: 2023-05-07 | Discharge: 2023-05-07 | Disposition: A | Discharge status: Home / Self Care | Payer: BC Managed Care – PPO | Attending: Emergency Medicine | Admitting: Emergency Medicine

## 2023-05-07 ENCOUNTER — Other Ambulatory Visit: Payer: Self-pay

## 2023-05-07 DIAGNOSIS — R079 Chest pain, unspecified: Secondary | ICD-10-CM | POA: Diagnosis not present

## 2023-05-07 DIAGNOSIS — R0789 Other chest pain: Secondary | ICD-10-CM | POA: Diagnosis not present

## 2023-05-07 DIAGNOSIS — I10 Essential (primary) hypertension: Secondary | ICD-10-CM | POA: Insufficient documentation

## 2023-05-07 DIAGNOSIS — E119 Type 2 diabetes mellitus without complications: Secondary | ICD-10-CM | POA: Diagnosis not present

## 2023-05-07 DIAGNOSIS — I517 Cardiomegaly: Secondary | ICD-10-CM | POA: Diagnosis not present

## 2023-05-07 LAB — BASIC METABOLIC PANEL
Anion gap: 11 (ref 5–15)
BUN: 14 mg/dL (ref 6–20)
CO2: 24 mmol/L (ref 22–32)
Calcium: 9.7 mg/dL (ref 8.9–10.3)
Chloride: 102 mmol/L (ref 98–111)
Creatinine, Ser: 1.18 mg/dL (ref 0.61–1.24)
GFR, Estimated: >60 mL/min (ref >60)
Glucose, Bld: 177 mg/dL — ABNORMAL HIGH (ref 70–99)
Potassium: 3.7 mmol/L (ref 3.5–5.1)
Sodium: 137 mmol/L (ref 135–145)

## 2023-05-07 LAB — CBC
HCT: 43.4 % (ref 39.0–52.0)
Hemoglobin: 15.1 g/dL (ref 13.0–17.0)
MCH: 29 pg (ref 26.0–34.0)
MCHC: 34.8 g/dL (ref 30.0–36.0)
MCV: 83.5 fL (ref 80.0–100.0)
Platelets: 232 10*3/uL (ref 150–400)
RBC: 5.2 MIL/uL (ref 4.22–5.81)
RDW: 12.6 % (ref 11.5–15.5)
WBC: 6.5 10*3/uL (ref 4.0–10.5)
nRBC: 0 % (ref 0.0–0.2)

## 2023-05-07 LAB — TROPONIN I (HIGH SENSITIVITY): Troponin I (High Sensitivity): 2 ng/L (ref <18)

## 2023-05-07 MED ORDER — IOHEXOL 350 MG/ML SOLN
75.0000 mL | Freq: Once | INTRAVENOUS | Status: AC | PRN
  Administered 2023-05-07: 75 mL via INTRAVENOUS

## 2023-05-07 NOTE — ED Provider Notes (Signed)
[facility] Provider Note    Event Date/Time   First MD Initiated Contact with Patient 05/07/23 1328     (approximate)   History   Chest Pain   HPI  Ethan Logan is a 47 y.o. male with a history of diabetes, hypertension who presents with complaints of left-sided chest pain which has been ongoing for nearly 2 weeks.  He reports it started while he was exerting himself playing basketball and has been present since.  He reports it is worse with deep inspiration.  No fevers chills cough.  No history of DVT.     Physical Exam   Triage Vital Signs: ED Triage Vitals [05/07/23 1246]  Encounter Vitals Group     BP 132/85     Systolic BP Percentile      Diastolic BP Percentile      Pulse Rate 92     Resp 18     Temp 98.2 F (36.8 C)     Temp src      SpO2 98 %     Weight 127 kg (280 lb)     Height 1.829 m (6')     Head Circumference      Peak Flow      Pain Score 3     Pain Loc      Pain Education      Exclude from Growth Chart     Most recent vital signs: Vitals:   05/07/23 1246  BP: 132/85  Pulse: 92  Resp: 18  Temp: 98.2 F (36.8 C)  SpO2: 98%     General: Awake, no distress.  CV:  Good peripheral perfusion.  No chest wall tenderness to palpation Resp:  Normal effort.  Abd:  No distention.  Other:  No calf pain or swelling   ED Results / Procedures / Treatments   Labs (all labs ordered are listed, but only abnormal results are displayed) Labs Reviewed  BASIC METABOLIC PANEL - Abnormal; Notable for the following components:      Result Value   Glucose, Bld 177 (*)    All other components within normal limits  CBC  TROPONIN I (HIGH SENSITIVITY)  TROPONIN I (HIGH SENSITIVITY)     EKG  ED ECG REPORT I, Jene Every, the attending physician, personally viewed and interpreted this ECG.  Date: 05/07/2023  Rhythm: normal sinus rhythm QRS Axis: normal Intervals: normal ST/T Wave abnormalities: normal Narrative  Interpretation: no evidence of acute ischemia    RADIOLOGY Chest x-ray viewed interpret by me, no acute abnormality    PROCEDURES:  Critical Care performed:   Procedures   MEDICATIONS ORDERED IN ED: Medications  iohexol (OMNIPAQUE) 350 MG/ML injection 75 mL (has no administration in time range)     IMPRESSION / MDM / ASSESSMENT AND PLAN / ED COURSE  I reviewed the triage vital signs and the nursing notes. Patient's presentation is most consistent with acute presentation with potential threat to life or bodily function.  Patient presents with chest pain as detailed above, differential includes ACS, PE, pneumonia, musculoskeletal pain.  EKG generally reassuring, high sensitive troponin is normal.  Chest x-ray without evidence of pneumonia, will send for CT angiography to rule out PE.    Clinical Course as of 05/07/23 1454  Sun May 07, 2023  1340 DG Chest 2 View [RK]    Clinical Course User Index [RK] Jene Every, MD     FINAL CLINICAL IMPRESSION(S) / ED DIAGNOSES   Final diagnoses:  Nonspecific chest pain     Rx / DC Orders   ED Discharge Orders     None        Note:  This document was prepared using Dragon voice recognition software and may include unintentional dictation errors.

## 2023-05-07 NOTE — ED Triage Notes (Signed)
PT states on and off chest pain for the last several weeks, but has not developed shortness of breath, which he states is concerning. Pt denies previous cardiac history.

## 2023-05-11 DIAGNOSIS — E119 Type 2 diabetes mellitus without complications: Secondary | ICD-10-CM | POA: Diagnosis not present

## 2023-05-12 ENCOUNTER — Ambulatory Visit (HOSPITAL_COMMUNITY)
Admission: RE | Admit: 2023-05-12 | Discharge: 2023-05-12 | Disposition: A | Discharge status: Home / Self Care | Payer: BC Managed Care – PPO | Source: Ambulatory Visit | Attending: Internal Medicine | Admitting: Internal Medicine

## 2023-05-12 ENCOUNTER — Encounter: Payer: Self-pay | Admitting: Internal Medicine

## 2023-05-12 ENCOUNTER — Ambulatory Visit: Payer: BC Managed Care – PPO | Attending: Internal Medicine | Admitting: Internal Medicine

## 2023-05-12 VITALS — BP 128/82 | HR 84 | Ht 73.0 in | Wt 277.0 lb

## 2023-05-12 DIAGNOSIS — M4692 Unspecified inflammatory spondylopathy, cervical region: Secondary | ICD-10-CM | POA: Diagnosis not present

## 2023-05-12 DIAGNOSIS — M8580 Other specified disorders of bone density and structure, unspecified site: Secondary | ICD-10-CM | POA: Diagnosis not present

## 2023-05-12 DIAGNOSIS — I517 Cardiomegaly: Secondary | ICD-10-CM

## 2023-05-12 DIAGNOSIS — R29898 Other symptoms and signs involving the musculoskeletal system: Secondary | ICD-10-CM | POA: Diagnosis not present

## 2023-05-12 DIAGNOSIS — R0789 Other chest pain: Secondary | ICD-10-CM | POA: Insufficient documentation

## 2023-05-12 DIAGNOSIS — M47812 Spondylosis without myelopathy or radiculopathy, cervical region: Secondary | ICD-10-CM | POA: Diagnosis not present

## 2023-05-12 NOTE — Patient Instructions (Signed)
Medication Instructions:  Your physician recommends that you continue on your current medications as directed. Please refer to the Current Medication list given to you today.  *If you need a refill on your cardiac medications before your next appointment, please call your pharmacy*   Lab Work: None If you have labs (blood work) drawn today and your tests are completely normal, you will receive your results only by: MyChart Message (if you have MyChart) OR A paper copy in the mail If you have any lab test that is abnormal or we need to change your treatment, we will call you to review the results.   Testing/Procedures: Your physician has requested that you have an echocardiogram. Echocardiography is a painless test that uses sound waves to create images of your heart. It provides your doctor with information about the size and shape of your heart and how well your heart's chambers and valves are working. This procedure takes approximately one hour. There are no restrictions for this procedure. Please do NOT wear cologne, perfume, aftershave, or lotions (deodorant is allowed). Please arrive 15 minutes prior to your appointment time.  Cervical Spine XRay   Follow-Up: At [facility], you and your health needs are our priority.  As part of our continuing mission to provide you with exceptional heart care, we have created designated Provider Care Teams.  These Care Teams include your primary Cardiologist (physician) and Advanced Practice Providers (APPs -  Physician Assistants and Nurse Practitioners) who all work together to provide you with the care you need, when you need it.  We recommend signing up for the patient portal called "MyChart".  Sign up information is provided on this After Visit Summary.  MyChart is used to connect with patients for Virtual Visits (Telemedicine).  Patients are able to view lab/test results, encounter notes, upcoming appointments, etc.  Non-urgent  messages can be sent to your provider as well.   To learn more about what you can do with MyChart, go to [email or web address].    Your next appointment:    Pending Testing Results  Provider:   Luane School, MD    Other Instructions

## 2023-05-12 NOTE — Progress Notes (Signed)
Cardiology Office Note  Date: 05/12/2023   ID: Ethan Logan, DOB 1976-01-31, MRN [id number]  PCP:  [facility]  Cardiologist:  Marjo Bicker, MD Electrophysiologist:  None    History of Present Illness: Ethan Logan is a 47 y.o. male known to have HTN, DM 2 was referred to cardiology for evaluation of chest pain.  Patient reported having chest pain and left arm weakness for the last 3 weeks.  Chest pain and left arm weakness did not occur at the same time.  He has neck pain with pain shooting down his left arm associated with numbness and mostly weakness for quite a while.  He also gets chest pains on the top of this left arm weakness especially when he lays in the bed in certain positions.  The chest pain currently resolves when he changes ablation.  This pain lasts for a few seconds.  No chest pain with exertion.  No DOE, orthopnea, PND or leg swelling.  No syncope, palpitations.  He has a family history of CAD (father had PCI in his late 14s or 67s).  Past Medical History:  Diagnosis Date   Diabetes mellitus without complication (HCC)    Hypertension    Pneumonia 1980    Past Surgical History:  Procedure Laterality Date   CYST EXCISION Right ~ 2000   "big one on my lower leg"   EXTRACORPOREAL SHOCK WAVE LITHOTRIPSY Right 05/24/2022   Procedure: EXTRACORPOREAL SHOCK WAVE LITHOTRIPSY (ESWL);  Surgeon: Milderd Meager., MD;  Location: [facility];  Service: Urology;  Laterality: Right;    Current Outpatient Medications  Medication Sig Dispense Refill   fenofibrate 160 MG tablet Take 160 mg by mouth daily.     hydrochlorothiazide (MICROZIDE) 12.5 MG capsule Take 12.5 mg by mouth daily.     JANUMET 50-1000 MG tablet Take 1 tablet by mouth 2 (two) times daily.     montelukast (SINGULAIR) 10 MG tablet Take 10 mg by mouth daily.     tadalafil (CIALIS) 10 MG tablet Take 10 mg by mouth daily as needed.     ezetimibe (ZETIA) 10 MG tablet  Take 10 mg by mouth daily. (Patient not taking: Reported on 05/12/2023)     No current facility-administered medications for this visit.   Allergies:  Aspirin and Penicillins   Social History: The patient  reports that he has never smoked. His smokeless tobacco use includes snuff. He reports current alcohol use. He reports that he does not use drugs.   Family History: The patient's family history includes Cancer in his father; Diabetes in his mother; Febrile seizures in his father; Heart attack in his father.   ROS:  Please see the history of present illness. Otherwise, complete review of systems is positive for none.  All other systems are reviewed and negative.   Physical Exam: VS:  BP 128/82   Pulse 84   Ht 6\' 1"  (1.854 m)   Wt 277 lb (125.6 kg)   SpO2 98%   BMI 36.55 kg/m , BMI Body mass index is 36.55 kg/m.  Wt Readings from Last 3 Encounters:  05/12/23 277 lb (125.6 kg)  05/07/23 280 lb (127 kg)  05/24/22 279 lb 15.8 oz (127 kg)    General: Patient appears comfortable at rest. HEENT: Conjunctiva and lids normal, oropharynx clear with moist mucosa. Neck: Supple, no elevated JVP or carotid bruits, no thyromegaly. Lungs: Clear to auscultation, nonlabored breathing at rest. Cardiac: Regular rate and rhythm,  no S3 or significant systolic murmur, no pericardial rub. Abdomen: Soft, nontender, no hepatomegaly, bowel sounds present, no guarding or rebound. Extremities: No pitting edema, distal pulses 2+. Skin: Warm and dry. Musculoskeletal: No kyphosis. Neuropsychiatric: Alert and oriented x3, affect grossly appropriate.  Recent Labwork: 05/07/2023: BUN 14; Creatinine, Ser 1.18; Hemoglobin 15.1; Platelets 232; Potassium 3.7; Sodium 137  No results found for: "CHOL", "TRIG", "HDL", "CHOLHDL", "VLDL", "LDLCALC", "LDLDIRECT"   Assessment and Plan:  # Noncardiac chest pain -Patient has chest pain lasting for few seconds especially when lying in the bed in sitting position.  This  chest pain resolves after changing the position.  # Left arm weakness, rule out cervical spondylitis -Patient reports shooting pain from his neck into the left arm associated with some numbness and mostly weakness.  He also complains of pain in his neck. Obtain x-ray of cervical spine. -If the cervical spine x-ray is normal, he will benefit from stress testing due to family history of CAD (father had PCI in his late 4s or 70s).  # Imaging evidence of cardiomegaly -CT angio PE from 7/24 showed cardiomegaly.  Obtain 2D echocardiogram, no DOE, orthopnea, PND or leg swelling. Rule out stage B cardiomyopathy.  I have spent a total of 45 minutes with patient reviewing chart, EKGs, labs and examining patient as well as establishing an assessment and plan that was discussed with the patient.  > 50% of time was spent in direct patient care.    Medication Adjustments/Labs and Tests Ordered: Current medicines are reviewed at length with the patient today.  Concerns regarding medicines are outlined above.   Tests Ordered: Orders Placed This Encounter  Procedures   DG Cervical Spine 2 or 3 views   ECHOCARDIOGRAM COMPLETE    Medication Changes: No orders of the defined types were placed in this encounter.   Disposition:  Follow up  pending results  Signed, Tammera Engert Verne Spurr, MD, 05/12/2023 2:00 PM    [facility] [address] [place], [place] [address]

## 2023-05-15 DIAGNOSIS — F419 Anxiety disorder, unspecified: Secondary | ICD-10-CM | POA: Diagnosis not present

## 2023-05-22 ENCOUNTER — Telehealth: Payer: Self-pay

## 2023-05-22 DIAGNOSIS — R079 Chest pain, unspecified: Secondary | ICD-10-CM

## 2023-05-22 NOTE — Telephone Encounter (Signed)
I discussed cervical x-ray results with patient, he will follow up with pcp.Report copied to them.   Order entered for exercise myoview, instructions discussed with patient to include: NPO 6 hours before test, wear clothing appropriate to exercise in. He can take his am meds with a sip of water.

## 2023-05-22 NOTE — Telephone Encounter (Signed)
-----   Message from Vishnu P Mallipeddi sent at 05/19/2023  8:41 AM EDT ----- Mild degenerative changes in the neck however has osteopenia which is unusual a 47 year old male according to the report. Follow-up with PCP for osteopenia management. Recommended CT cervical spine if there was concern for occult fracture which will need to be followed by PCP. From cardiology standpoint, obtain treadmill exercise Myoview (diagnosis: Chest pain radiating to the left arm).

## 2023-05-26 DIAGNOSIS — F411 Generalized anxiety disorder: Secondary | ICD-10-CM | POA: Diagnosis not present

## 2023-05-26 DIAGNOSIS — R079 Chest pain, unspecified: Secondary | ICD-10-CM | POA: Diagnosis not present

## 2023-05-26 DIAGNOSIS — R0602 Shortness of breath: Secondary | ICD-10-CM | POA: Diagnosis not present

## 2023-05-26 DIAGNOSIS — R0789 Other chest pain: Secondary | ICD-10-CM | POA: Diagnosis not present

## 2023-06-01 ENCOUNTER — Telehealth (HOSPITAL_COMMUNITY): Payer: Self-pay | Admitting: Surgery

## 2023-06-01 NOTE — Telephone Encounter (Signed)
I called the pt to remind him of his stress test tomorrow at 9:30.  I instructed the pt to get to [facility] at 9:15, as well as to not eat or drink 6 hours prior to the test, do not drink any caffeine or eat chocolate, and hold his blood pressure medication until after the test.  He was also instructed to hold his Janumet until after the test since he will be NPO for 6 hours prior.  He verbalized understanding of these instructions.

## 2023-06-02 ENCOUNTER — Ambulatory Visit (HOSPITAL_COMMUNITY)
Admission: RE | Admit: 2023-06-02 | Discharge: 2023-06-02 | Disposition: A | Discharge status: Home / Self Care | Payer: BC Managed Care – PPO | Source: Ambulatory Visit | Attending: Internal Medicine | Admitting: Internal Medicine

## 2023-06-02 ENCOUNTER — Encounter (HOSPITAL_COMMUNITY): Payer: Self-pay

## 2023-06-02 ENCOUNTER — Encounter (HOSPITAL_BASED_OUTPATIENT_CLINIC_OR_DEPARTMENT_OTHER): Admission: RE | Admit: 2023-06-02 | Payer: BC Managed Care – PPO | Source: Ambulatory Visit

## 2023-06-02 DIAGNOSIS — I517 Cardiomegaly: Secondary | ICD-10-CM | POA: Insufficient documentation

## 2023-06-02 DIAGNOSIS — R079 Chest pain, unspecified: Secondary | ICD-10-CM

## 2023-06-02 LAB — NM MYOCAR MULTI W/SPECT W/WALL MOTION / EF
Angina Index: 0
Duke Treadmill Score: 8
Estimated workload: 10.4
Exercise duration (min): 7 min
Exercise duration (sec): 40 s
LV dias vol: 100 mL (ref 62–150)
LV sys vol: 44 mL
MPHR: 172 {beats}/min
Nuc Stress EF: 56 %
Peak HR: 162 {beats}/min
Percent HR: 94 %
RATE: 0.3
RPE: 13
Rest HR: 79 {beats}/min
Rest Nuclear Isotope Dose: 10 mCi
SDS: 0
SRS: 0
SSS: 0
ST Depression (mm): 0 mm
Stress Nuclear Isotope Dose: 27 mCi
TID: 1.2

## 2023-06-02 LAB — ECHOCARDIOGRAM COMPLETE
Area-P 1/2: 2.69 cm2
S' Lateral: 2.8 cm

## 2023-06-02 MED ORDER — TECHNETIUM TC 99M TETROFOSMIN IV KIT
30.0000 | PACK | Freq: Once | INTRAVENOUS | Status: AC | PRN
  Administered 2023-06-02: 27 via INTRAVENOUS

## 2023-06-02 MED ORDER — TECHNETIUM TC 99M TETROFOSMIN IV KIT
10.0000 | PACK | Freq: Once | INTRAVENOUS | Status: AC | PRN
  Administered 2023-06-02: 10 via INTRAVENOUS

## 2023-06-02 MED ORDER — SODIUM CHLORIDE FLUSH 0.9 % IV SOLN
INTRAVENOUS | Status: AC
  Administered 2023-06-02: 10 mL via INTRAVENOUS
  Filled 2023-06-02: qty 10

## 2023-06-02 MED ORDER — REGADENOSON 0.4 MG/5ML IV SOLN
INTRAVENOUS | Status: AC
  Filled 2023-06-02: qty 5

## 2023-06-02 NOTE — Progress Notes (Signed)
*  PRELIMINARY RESULTS* Echocardiogram 2D Echocardiogram has been performed.  Ethan Logan 06/02/2023, 11:36 AM

## 2023-06-07 DIAGNOSIS — F429 Obsessive-compulsive disorder, unspecified: Secondary | ICD-10-CM | POA: Diagnosis not present

## 2023-06-07 DIAGNOSIS — F4001 Agoraphobia with panic disorder: Secondary | ICD-10-CM | POA: Diagnosis not present

## 2023-06-07 DIAGNOSIS — F4529 Other hypochondriacal disorders: Secondary | ICD-10-CM | POA: Diagnosis not present

## 2023-06-11 DIAGNOSIS — E119 Type 2 diabetes mellitus without complications: Secondary | ICD-10-CM | POA: Diagnosis not present

## 2023-06-21 DIAGNOSIS — F411 Generalized anxiety disorder: Secondary | ICD-10-CM | POA: Diagnosis not present

## 2023-06-22 DIAGNOSIS — E7800 Pure hypercholesterolemia, unspecified: Secondary | ICD-10-CM | POA: Diagnosis not present

## 2023-06-22 DIAGNOSIS — Z1329 Encounter for screening for other suspected endocrine disorder: Secondary | ICD-10-CM | POA: Diagnosis not present

## 2023-06-22 DIAGNOSIS — E139 Other specified diabetes mellitus without complications: Secondary | ICD-10-CM | POA: Diagnosis not present

## 2023-06-22 DIAGNOSIS — E7801 Familial hypercholesterolemia: Secondary | ICD-10-CM | POA: Diagnosis not present

## 2023-06-22 DIAGNOSIS — I10 Essential (primary) hypertension: Secondary | ICD-10-CM | POA: Diagnosis not present

## 2023-06-28 DIAGNOSIS — F411 Generalized anxiety disorder: Secondary | ICD-10-CM | POA: Diagnosis not present

## 2023-07-03 ENCOUNTER — Ambulatory Visit: Payer: BC Managed Care – PPO | Admitting: Cardiology

## 2023-07-05 DIAGNOSIS — F410 Panic disorder [episodic paroxysmal anxiety] without agoraphobia: Secondary | ICD-10-CM | POA: Diagnosis not present

## 2023-07-05 DIAGNOSIS — F331 Major depressive disorder, recurrent, moderate: Secondary | ICD-10-CM | POA: Diagnosis not present

## 2023-07-05 DIAGNOSIS — F429 Obsessive-compulsive disorder, unspecified: Secondary | ICD-10-CM | POA: Diagnosis not present

## 2023-07-05 DIAGNOSIS — F411 Generalized anxiety disorder: Secondary | ICD-10-CM | POA: Diagnosis not present

## 2023-07-06 DIAGNOSIS — F411 Generalized anxiety disorder: Secondary | ICD-10-CM | POA: Diagnosis not present

## 2023-07-11 DIAGNOSIS — E119 Type 2 diabetes mellitus without complications: Secondary | ICD-10-CM | POA: Diagnosis not present

## 2023-07-13 DIAGNOSIS — F411 Generalized anxiety disorder: Secondary | ICD-10-CM | POA: Diagnosis not present

## 2023-07-19 DIAGNOSIS — F411 Generalized anxiety disorder: Secondary | ICD-10-CM | POA: Diagnosis not present

## 2023-07-20 DIAGNOSIS — Z91018 Allergy to other foods: Secondary | ICD-10-CM | POA: Diagnosis not present

## 2023-07-20 DIAGNOSIS — J309 Allergic rhinitis, unspecified: Secondary | ICD-10-CM | POA: Diagnosis not present

## 2023-07-20 DIAGNOSIS — T781XXA Other adverse food reactions, not elsewhere classified, initial encounter: Secondary | ICD-10-CM | POA: Diagnosis not present

## 2023-07-26 DIAGNOSIS — F411 Generalized anxiety disorder: Secondary | ICD-10-CM | POA: Diagnosis not present

## 2023-08-02 DIAGNOSIS — F410 Panic disorder [episodic paroxysmal anxiety] without agoraphobia: Secondary | ICD-10-CM | POA: Diagnosis not present

## 2023-08-02 DIAGNOSIS — F429 Obsessive-compulsive disorder, unspecified: Secondary | ICD-10-CM | POA: Diagnosis not present

## 2023-08-02 DIAGNOSIS — F4521 Hypochondriasis: Secondary | ICD-10-CM | POA: Diagnosis not present

## 2023-08-02 DIAGNOSIS — F411 Generalized anxiety disorder: Secondary | ICD-10-CM | POA: Diagnosis not present

## 2023-08-03 DIAGNOSIS — F411 Generalized anxiety disorder: Secondary | ICD-10-CM | POA: Diagnosis not present

## 2023-08-10 DIAGNOSIS — F411 Generalized anxiety disorder: Secondary | ICD-10-CM | POA: Diagnosis not present

## 2023-08-11 DIAGNOSIS — E119 Type 2 diabetes mellitus without complications: Secondary | ICD-10-CM | POA: Diagnosis not present

## 2023-08-17 DIAGNOSIS — F411 Generalized anxiety disorder: Secondary | ICD-10-CM | POA: Diagnosis not present

## 2023-08-24 DIAGNOSIS — F411 Generalized anxiety disorder: Secondary | ICD-10-CM | POA: Diagnosis not present

## 2023-08-30 DIAGNOSIS — F429 Obsessive-compulsive disorder, unspecified: Secondary | ICD-10-CM | POA: Diagnosis not present

## 2023-08-30 DIAGNOSIS — F411 Generalized anxiety disorder: Secondary | ICD-10-CM | POA: Diagnosis not present

## 2023-08-30 DIAGNOSIS — F410 Panic disorder [episodic paroxysmal anxiety] without agoraphobia: Secondary | ICD-10-CM | POA: Diagnosis not present

## 2023-08-30 DIAGNOSIS — F3342 Major depressive disorder, recurrent, in full remission: Secondary | ICD-10-CM | POA: Diagnosis not present

## 2023-09-04 DIAGNOSIS — F411 Generalized anxiety disorder: Secondary | ICD-10-CM | POA: Diagnosis not present

## 2023-09-10 DIAGNOSIS — E119 Type 2 diabetes mellitus without complications: Secondary | ICD-10-CM | POA: Diagnosis not present

## 2023-09-14 DIAGNOSIS — F411 Generalized anxiety disorder: Secondary | ICD-10-CM | POA: Diagnosis not present

## 2023-09-21 DIAGNOSIS — F411 Generalized anxiety disorder: Secondary | ICD-10-CM | POA: Diagnosis not present

## 2023-09-28 DIAGNOSIS — F411 Generalized anxiety disorder: Secondary | ICD-10-CM | POA: Diagnosis not present

## 2023-10-11 DIAGNOSIS — E119 Type 2 diabetes mellitus without complications: Secondary | ICD-10-CM | POA: Diagnosis not present

## 2023-10-12 DIAGNOSIS — F411 Generalized anxiety disorder: Secondary | ICD-10-CM | POA: Diagnosis not present

## 2023-10-27 DIAGNOSIS — F411 Generalized anxiety disorder: Secondary | ICD-10-CM | POA: Diagnosis not present

## 2023-11-09 DIAGNOSIS — F411 Generalized anxiety disorder: Secondary | ICD-10-CM | POA: Diagnosis not present

## 2023-11-11 DIAGNOSIS — E119 Type 2 diabetes mellitus without complications: Secondary | ICD-10-CM | POA: Diagnosis not present

## 2023-11-23 DIAGNOSIS — F411 Generalized anxiety disorder: Secondary | ICD-10-CM | POA: Diagnosis not present

## 2023-12-07 DIAGNOSIS — F411 Generalized anxiety disorder: Secondary | ICD-10-CM | POA: Diagnosis not present

## 2023-12-07 DIAGNOSIS — F410 Panic disorder [episodic paroxysmal anxiety] without agoraphobia: Secondary | ICD-10-CM | POA: Diagnosis not present

## 2023-12-07 DIAGNOSIS — F4520 Hypochondriacal disorder, unspecified: Secondary | ICD-10-CM | POA: Diagnosis not present

## 2023-12-07 DIAGNOSIS — F429 Obsessive-compulsive disorder, unspecified: Secondary | ICD-10-CM | POA: Diagnosis not present

## 2023-12-21 DIAGNOSIS — F411 Generalized anxiety disorder: Secondary | ICD-10-CM | POA: Diagnosis not present

## 2024-01-04 DIAGNOSIS — F411 Generalized anxiety disorder: Secondary | ICD-10-CM | POA: Diagnosis not present

## 2024-01-25 DIAGNOSIS — F411 Generalized anxiety disorder: Secondary | ICD-10-CM | POA: Diagnosis not present

## 2024-01-31 DIAGNOSIS — I10 Essential (primary) hypertension: Secondary | ICD-10-CM | POA: Diagnosis not present

## 2024-01-31 DIAGNOSIS — E559 Vitamin D deficiency, unspecified: Secondary | ICD-10-CM | POA: Diagnosis not present

## 2024-01-31 DIAGNOSIS — E785 Hyperlipidemia, unspecified: Secondary | ICD-10-CM | POA: Diagnosis not present

## 2024-01-31 DIAGNOSIS — E139 Other specified diabetes mellitus without complications: Secondary | ICD-10-CM | POA: Diagnosis not present

## 2024-02-08 DIAGNOSIS — E119 Type 2 diabetes mellitus without complications: Secondary | ICD-10-CM | POA: Diagnosis not present

## 2024-02-08 DIAGNOSIS — F411 Generalized anxiety disorder: Secondary | ICD-10-CM | POA: Diagnosis not present

## 2024-02-22 DIAGNOSIS — F411 Generalized anxiety disorder: Secondary | ICD-10-CM | POA: Diagnosis not present

## 2024-02-24 DIAGNOSIS — F411 Generalized anxiety disorder: Secondary | ICD-10-CM | POA: Diagnosis not present

## 2024-02-24 DIAGNOSIS — F410 Panic disorder [episodic paroxysmal anxiety] without agoraphobia: Secondary | ICD-10-CM | POA: Diagnosis not present

## 2024-02-24 DIAGNOSIS — F4521 Hypochondriasis: Secondary | ICD-10-CM | POA: Diagnosis not present

## 2024-02-24 DIAGNOSIS — F428 Other obsessive-compulsive disorder: Secondary | ICD-10-CM | POA: Diagnosis not present

## 2024-02-26 DIAGNOSIS — Z119 Encounter for screening for infectious and parasitic diseases, unspecified: Secondary | ICD-10-CM | POA: Diagnosis not present

## 2024-02-26 DIAGNOSIS — R3914 Feeling of incomplete bladder emptying: Secondary | ICD-10-CM | POA: Diagnosis not present

## 2024-02-26 DIAGNOSIS — S70369A Insect bite (nonvenomous), unspecified thigh, initial encounter: Secondary | ICD-10-CM | POA: Diagnosis not present

## 2024-02-26 DIAGNOSIS — R509 Fever, unspecified: Secondary | ICD-10-CM | POA: Diagnosis not present

## 2024-03-10 DIAGNOSIS — E119 Type 2 diabetes mellitus without complications: Secondary | ICD-10-CM | POA: Diagnosis not present

## 2024-03-21 DIAGNOSIS — F411 Generalized anxiety disorder: Secondary | ICD-10-CM | POA: Diagnosis not present

## 2024-04-04 DIAGNOSIS — F411 Generalized anxiety disorder: Secondary | ICD-10-CM | POA: Diagnosis not present

## 2024-04-09 DIAGNOSIS — E119 Type 2 diabetes mellitus without complications: Secondary | ICD-10-CM | POA: Diagnosis not present

## 2024-04-18 DIAGNOSIS — F411 Generalized anxiety disorder: Secondary | ICD-10-CM | POA: Diagnosis not present

## 2024-04-29 ENCOUNTER — Other Ambulatory Visit: Payer: Self-pay

## 2024-04-29 DIAGNOSIS — I77810 Thoracic aortic ectasia: Secondary | ICD-10-CM

## 2024-05-09 DIAGNOSIS — F411 Generalized anxiety disorder: Secondary | ICD-10-CM | POA: Diagnosis not present

## 2024-05-10 DIAGNOSIS — E119 Type 2 diabetes mellitus without complications: Secondary | ICD-10-CM | POA: Diagnosis not present

## 2024-05-14 DIAGNOSIS — F4520 Hypochondriacal disorder, unspecified: Secondary | ICD-10-CM | POA: Diagnosis not present

## 2024-05-14 DIAGNOSIS — F411 Generalized anxiety disorder: Secondary | ICD-10-CM | POA: Diagnosis not present

## 2024-05-14 DIAGNOSIS — F429 Obsessive-compulsive disorder, unspecified: Secondary | ICD-10-CM | POA: Diagnosis not present

## 2024-05-14 DIAGNOSIS — F410 Panic disorder [episodic paroxysmal anxiety] without agoraphobia: Secondary | ICD-10-CM | POA: Diagnosis not present

## 2024-05-21 ENCOUNTER — Telehealth: Payer: Self-pay | Admitting: Internal Medicine

## 2024-05-21 NOTE — Telephone Encounter (Signed)
 Pt called and is not sure why he is having a CT.  He has not been seen since last year.  He would like a call back from the nurse.    Best number [phone]

## 2024-05-22 NOTE — Telephone Encounter (Signed)
 Returned call to pt and notified that Dr. Mallipeddi requested the CT scan in a result note from last year. Pt voiced understanding.

## 2024-05-23 DIAGNOSIS — F411 Generalized anxiety disorder: Secondary | ICD-10-CM | POA: Diagnosis not present

## 2024-05-27 ENCOUNTER — Ambulatory Visit (HOSPITAL_COMMUNITY): Admission: RE | Admit: 2024-05-27 | Source: Ambulatory Visit

## 2024-06-10 DIAGNOSIS — E119 Type 2 diabetes mellitus without complications: Secondary | ICD-10-CM | POA: Diagnosis not present

## 2024-06-13 DIAGNOSIS — F411 Generalized anxiety disorder: Secondary | ICD-10-CM | POA: Diagnosis not present

## 2024-07-04 DIAGNOSIS — F411 Generalized anxiety disorder: Secondary | ICD-10-CM | POA: Diagnosis not present

## 2024-07-10 DIAGNOSIS — E119 Type 2 diabetes mellitus without complications: Secondary | ICD-10-CM | POA: Diagnosis not present

## 2024-07-18 DIAGNOSIS — F411 Generalized anxiety disorder: Secondary | ICD-10-CM | POA: Diagnosis not present

## 2024-08-01 DIAGNOSIS — F411 Generalized anxiety disorder: Secondary | ICD-10-CM | POA: Diagnosis not present

## 2024-08-10 DIAGNOSIS — E119 Type 2 diabetes mellitus without complications: Secondary | ICD-10-CM | POA: Diagnosis not present

## 2024-08-20 ENCOUNTER — Ambulatory Visit (HOSPITAL_COMMUNITY)
Admission: RE | Admit: 2024-08-20 | Discharge: 2024-08-20 | Disposition: A | Discharge status: Home / Self Care | Source: Ambulatory Visit | Attending: Internal Medicine | Admitting: Internal Medicine

## 2024-08-20 DIAGNOSIS — I77819 Aortic ectasia, unspecified site: Secondary | ICD-10-CM | POA: Diagnosis not present

## 2024-08-20 DIAGNOSIS — I77810 Thoracic aortic ectasia: Secondary | ICD-10-CM | POA: Diagnosis not present

## 2024-08-20 MED ORDER — IOHEXOL 350 MG/ML SOLN
80.0000 mL | Freq: Once | INTRAVENOUS | Status: AC | PRN
  Administered 2024-08-20: 80 mL via INTRAVENOUS

## 2024-08-22 ENCOUNTER — Ambulatory Visit: Payer: Self-pay | Admitting: Internal Medicine

## 2024-08-22 DIAGNOSIS — F429 Obsessive-compulsive disorder, unspecified: Secondary | ICD-10-CM | POA: Diagnosis not present

## 2024-08-22 DIAGNOSIS — F411 Generalized anxiety disorder: Secondary | ICD-10-CM | POA: Diagnosis not present

## 2024-08-22 DIAGNOSIS — F410 Panic disorder [episodic paroxysmal anxiety] without agoraphobia: Secondary | ICD-10-CM | POA: Diagnosis not present

## 2024-08-22 DIAGNOSIS — F3342 Major depressive disorder, recurrent, in full remission: Secondary | ICD-10-CM | POA: Diagnosis not present

## 2024-09-12 DIAGNOSIS — F411 Generalized anxiety disorder: Secondary | ICD-10-CM | POA: Diagnosis not present

## 2024-09-26 DIAGNOSIS — F411 Generalized anxiety disorder: Secondary | ICD-10-CM | POA: Diagnosis not present
